# Patient Record
Sex: Female | Born: 1985 | Race: Black or African American | Hispanic: No | Marital: Married | Smoking: Never smoker
Health system: Southern US, Community
[De-identification: ages and names within clinical notes are randomized; demographics above are authoritative.]

## PROBLEM LIST (undated history)

## (undated) DIAGNOSIS — F53 Postpartum depression: Secondary | ICD-10-CM

## (undated) DIAGNOSIS — O99345 Other mental disorders complicating the puerperium: Secondary | ICD-10-CM

## (undated) DIAGNOSIS — F419 Anxiety disorder, unspecified: Secondary | ICD-10-CM

## (undated) HISTORY — DX: Postpartum depression: F53.0

## (undated) HISTORY — DX: Other mental disorders complicating the puerperium: O99.345

## (undated) HISTORY — DX: Anxiety disorder, unspecified: F41.9

---
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
## 2007-04-22 ENCOUNTER — Emergency Department: Payer: Self-pay | Admitting: Emergency Medicine

## 2007-05-13 ENCOUNTER — Emergency Department: Payer: Self-pay | Admitting: Internal Medicine

## 2008-02-03 ENCOUNTER — Emergency Department (HOSPITAL_COMMUNITY): Admission: EM | Admit: 2008-02-03 | Discharge: 2008-02-04 | Payer: Self-pay | Admitting: Emergency Medicine

## 2008-06-09 ENCOUNTER — Emergency Department: Payer: Self-pay

## 2008-11-13 ENCOUNTER — Emergency Department: Payer: Self-pay | Admitting: Emergency Medicine

## 2009-06-23 ENCOUNTER — Encounter: Payer: Self-pay | Admitting: Obstetrics & Gynecology

## 2009-06-23 ENCOUNTER — Ambulatory Visit: Payer: Self-pay | Admitting: Obstetrics and Gynecology

## 2009-07-20 ENCOUNTER — Ambulatory Visit: Payer: Self-pay | Admitting: Obstetrics & Gynecology

## 2009-07-20 LAB — CONVERTED CEMR LAB: GC Probe Amp, Urine: NEGATIVE

## 2009-08-16 ENCOUNTER — Ambulatory Visit: Payer: Self-pay | Admitting: Obstetrics and Gynecology

## 2009-08-17 ENCOUNTER — Encounter: Payer: Self-pay | Admitting: Obstetrics and Gynecology

## 2009-08-17 LAB — CONVERTED CEMR LAB
WBC, Wet Prep HPF POC: NONE SEEN
Yeast Wet Prep HPF POC: NONE SEEN

## 2009-09-20 ENCOUNTER — Ambulatory Visit: Payer: Self-pay | Admitting: Obstetrics & Gynecology

## 2009-09-23 ENCOUNTER — Ambulatory Visit: Payer: Self-pay | Admitting: Obstetrics & Gynecology

## 2009-11-17 ENCOUNTER — Ambulatory Visit: Payer: Self-pay | Admitting: Family Medicine

## 2009-11-17 LAB — CONVERTED CEMR LAB
Chlamydia, DNA Probe: NEGATIVE
GC Probe Amp, Genital: NEGATIVE

## 2009-11-18 ENCOUNTER — Encounter: Payer: Self-pay | Admitting: Family Medicine

## 2009-11-18 LAB — CONVERTED CEMR LAB
Clue Cells Wet Prep HPF POC: NONE SEEN
Yeast Wet Prep HPF POC: NONE SEEN

## 2009-12-30 ENCOUNTER — Ambulatory Visit: Payer: Self-pay | Admitting: Obstetrics and Gynecology

## 2009-12-30 LAB — CONVERTED CEMR LAB: Chlamydia, Swab/Urine, PCR: NEGATIVE

## 2010-02-10 ENCOUNTER — Encounter: Payer: Self-pay | Admitting: Obstetrics and Gynecology

## 2010-02-10 ENCOUNTER — Ambulatory Visit: Payer: Self-pay | Admitting: Obstetrics & Gynecology

## 2010-02-14 ENCOUNTER — Encounter: Payer: Self-pay | Admitting: Obstetrics and Gynecology

## 2010-02-14 LAB — CONVERTED CEMR LAB: LH: 3.5 milliintl units/mL

## 2010-03-15 ENCOUNTER — Ambulatory Visit: Payer: Self-pay | Admitting: Family Medicine

## 2010-04-06 ENCOUNTER — Encounter: Payer: Self-pay | Admitting: Obstetrics & Gynecology

## 2010-04-06 ENCOUNTER — Ambulatory Visit: Payer: Self-pay | Admitting: Obstetrics & Gynecology

## 2010-04-06 LAB — CONVERTED CEMR LAB
Antibody Screen: NEGATIVE
hCG, Beta Chain, Quant, S: 41384.4 milliintl units/mL

## 2010-04-07 ENCOUNTER — Ambulatory Visit (HOSPITAL_COMMUNITY)
Admission: RE | Admit: 2010-04-07 | Discharge: 2010-04-07 | Payer: Self-pay | Source: Home / Self Care | Admitting: Obstetrics & Gynecology

## 2010-04-13 ENCOUNTER — Encounter: Payer: Self-pay | Admitting: Obstetrics & Gynecology

## 2010-04-13 ENCOUNTER — Ambulatory Visit: Payer: Self-pay | Admitting: Obstetrics and Gynecology

## 2010-04-13 LAB — CONVERTED CEMR LAB
Antibody Screen: NEGATIVE
Eosinophils Absolute: 0.1 10*3/uL (ref 0.0–0.7)
HCT: 40.2 % (ref 36.0–46.0)
Hepatitis B Surface Ag: NEGATIVE
Hgb F Quant: 0 % (ref 0.0–2.0)
Hgb S Quant: 0 % (ref 0.0–0.0)
Lymphocytes Relative: 13 % (ref 12–46)
Neutro Abs: 6.3 10*3/uL (ref 1.7–7.7)
Platelets: 276 10*3/uL (ref 150–400)
RDW: 12.2 % (ref 11.5–15.5)
Rubella: 212.5 intl units/mL — ABNORMAL HIGH

## 2010-04-26 ENCOUNTER — Ambulatory Visit: Payer: Self-pay | Admitting: Family Medicine

## 2010-05-03 ENCOUNTER — Emergency Department: Payer: Self-pay | Admitting: Emergency Medicine

## 2010-05-10 ENCOUNTER — Ambulatory Visit: Payer: Self-pay | Admitting: Obstetrics & Gynecology

## 2010-05-17 ENCOUNTER — Ambulatory Visit: Payer: Self-pay | Admitting: Family Medicine

## 2010-06-07 ENCOUNTER — Ambulatory Visit: Payer: Self-pay | Admitting: Obstetrics & Gynecology

## 2010-06-28 ENCOUNTER — Ambulatory Visit (HOSPITAL_COMMUNITY)
Admission: RE | Admit: 2010-06-28 | Discharge: 2010-06-28 | Payer: Self-pay | Source: Home / Self Care | Attending: Obstetrics & Gynecology | Admitting: Obstetrics & Gynecology

## 2010-06-29 ENCOUNTER — Ambulatory Visit: Payer: Self-pay | Admitting: Family Medicine

## 2010-07-13 ENCOUNTER — Ambulatory Visit
Admission: RE | Admit: 2010-07-13 | Discharge: 2010-07-13 | Payer: Self-pay | Source: Home / Self Care | Attending: Obstetrics and Gynecology | Admitting: Obstetrics and Gynecology

## 2010-07-15 ENCOUNTER — Ambulatory Visit (HOSPITAL_COMMUNITY)
Admission: RE | Admit: 2010-07-15 | Discharge: 2010-07-15 | Payer: Self-pay | Source: Home / Self Care | Attending: Family Medicine | Admitting: Family Medicine

## 2010-08-09 ENCOUNTER — Encounter: Payer: Self-pay | Admitting: Obstetrics & Gynecology

## 2010-08-09 ENCOUNTER — Ambulatory Visit
Admission: RE | Admit: 2010-08-09 | Discharge: 2010-08-09 | Payer: Self-pay | Source: Home / Self Care | Attending: Obstetrics & Gynecology | Admitting: Obstetrics & Gynecology

## 2010-08-09 LAB — CONVERTED CEMR LAB: Chlamydia, Swab/Urine, PCR: NEGATIVE

## 2010-08-10 ENCOUNTER — Encounter: Payer: Self-pay | Admitting: Obstetrics & Gynecology

## 2010-08-30 ENCOUNTER — Encounter: Payer: Self-pay | Admitting: Obstetrics & Gynecology

## 2010-08-30 ENCOUNTER — Encounter: Payer: No Typology Code available for payment source | Admitting: Obstetrics and Gynecology

## 2010-08-30 DIAGNOSIS — Z3400 Encounter for supervision of normal first pregnancy, unspecified trimester: Secondary | ICD-10-CM

## 2010-08-30 LAB — CONVERTED CEMR LAB
HCT: 34.9 % — ABNORMAL LOW (ref 36.0–46.0)
HIV: NONREACTIVE
MCV: 97.5 fL (ref 78.0–100.0)
Platelets: 235 10*3/uL (ref 150–400)
RBC: 3.58 M/uL — ABNORMAL LOW (ref 3.87–5.11)
RDW: 12.5 % (ref 11.5–15.5)
WBC: 8.5 10*3/uL (ref 4.0–10.5)

## 2010-09-20 ENCOUNTER — Encounter: Payer: No Typology Code available for payment source | Admitting: Obstetrics and Gynecology

## 2010-09-20 DIAGNOSIS — Z3400 Encounter for supervision of normal first pregnancy, unspecified trimester: Secondary | ICD-10-CM

## 2010-09-26 ENCOUNTER — Other Ambulatory Visit: Payer: No Typology Code available for payment source

## 2010-09-26 DIAGNOSIS — Z111 Encounter for screening for respiratory tuberculosis: Secondary | ICD-10-CM

## 2010-10-04 ENCOUNTER — Encounter: Payer: No Typology Code available for payment source | Admitting: Obstetrics and Gynecology

## 2010-10-04 DIAGNOSIS — Z3400 Encounter for supervision of normal first pregnancy, unspecified trimester: Secondary | ICD-10-CM

## 2010-10-07 ENCOUNTER — Observation Stay: Payer: Self-pay | Admitting: Obstetrics & Gynecology

## 2010-10-10 ENCOUNTER — Encounter: Payer: PRIVATE HEALTH INSURANCE | Admitting: Obstetrics and Gynecology

## 2010-10-10 ENCOUNTER — Inpatient Hospital Stay (HOSPITAL_COMMUNITY)
Admission: AD | Admit: 2010-10-10 | Discharge: 2010-10-17 | DRG: 775 | Disposition: A | Discharge status: Home / Self Care | Payer: Medicaid Other | Source: Ambulatory Visit | Attending: Obstetrics and Gynecology | Admitting: Obstetrics and Gynecology

## 2010-10-10 DIAGNOSIS — Z22330 Carrier of Group B streptococcus: Secondary | ICD-10-CM

## 2010-10-10 DIAGNOSIS — O9989 Other specified diseases and conditions complicating pregnancy, childbirth and the puerperium: Secondary | ICD-10-CM

## 2010-10-10 DIAGNOSIS — O99892 Other specified diseases and conditions complicating childbirth: Secondary | ICD-10-CM | POA: Diagnosis present

## 2010-10-10 LAB — URINE MICROSCOPIC-ADD ON

## 2010-10-10 LAB — CBC
Hemoglobin: 12.6 g/dL (ref 12.0–15.0)
MCH: 33.3 pg (ref 26.0–34.0)
MCHC: 34.9 g/dL (ref 30.0–36.0)
MCV: 95.5 fL (ref 78.0–100.0)
Platelets: 256 10*3/uL (ref 150–400)
RDW: 12.3 % (ref 11.5–15.5)

## 2010-10-10 LAB — URINALYSIS, ROUTINE W REFLEX MICROSCOPIC
Ketones, ur: NEGATIVE mg/dL
Nitrite: NEGATIVE
Specific Gravity, Urine: 1.01 (ref 1.005–1.030)
Urobilinogen, UA: 1 mg/dL (ref 0.0–1.0)

## 2010-10-11 LAB — URINE CULTURE
Colony Count: NO GROWTH
Culture: NO GROWTH

## 2010-10-11 LAB — CBC
HCT: 32.6 % — ABNORMAL LOW (ref 36.0–46.0)
Hemoglobin: 10.8 g/dL — ABNORMAL LOW (ref 12.0–15.0)
MCHC: 33.1 g/dL (ref 30.0–36.0)
Platelets: 232 10*3/uL (ref 150–400)
RBC: 3.34 MIL/uL — ABNORMAL LOW (ref 3.87–5.11)
RDW: 12.5 % (ref 11.5–15.5)
WBC: 15.5 10*3/uL — ABNORMAL HIGH (ref 4.0–10.5)

## 2010-10-12 ENCOUNTER — Encounter: Payer: PRIVATE HEALTH INSURANCE | Admitting: Obstetrics & Gynecology

## 2010-10-12 LAB — STREP B DNA PROBE

## 2010-10-13 LAB — MAGNESIUM: Magnesium: 6.7 mg/dL (ref 1.5–2.5)

## 2010-10-15 DIAGNOSIS — O9989 Other specified diseases and conditions complicating pregnancy, childbirth and the puerperium: Secondary | ICD-10-CM

## 2010-10-15 LAB — CBC
MCH: 32.9 pg (ref 26.0–34.0)
Platelets: 284 10*3/uL (ref 150–400)
RBC: 3.83 MIL/uL — ABNORMAL LOW (ref 3.87–5.11)
WBC: 12 10*3/uL — ABNORMAL HIGH (ref 4.0–10.5)

## 2010-11-14 ENCOUNTER — Ambulatory Visit (INDEPENDENT_AMBULATORY_CARE_PROVIDER_SITE_OTHER): Payer: Medicaid Other | Admitting: Obstetrics & Gynecology

## 2010-11-22 NOTE — Assessment & Plan Note (Signed)
[name], [name]              ACCOUNT NO.:  [id number]   MEDICAL RECORD NO.:  [id number]          PATIENT TYPE:  POB   LOCATION:  [facility] at [facility]         FACILITY:  [facility]   PHYSICIAN:  Tinnie Gens, MD        DATE OF BIRTH:  03-26-1986   DATE OF SERVICE:  11/17/2009                                  CLINIC NOTE   CHIEF COMPLAINT:  Vaginal discharge.   HISTORY OF PRESENT ILLNESS:  The patient is a 25 year old, nullipara,  who has a history of Chlamydia in the past.  She comes in today with  white-creamy vaginal discharge, some minor irritation related to that,  no significant odor, no burning with urination, no semi-itching.  She  denies fevers, chills, nausea, vomiting, or abdominal pain.  The patient  does bath 3-4 times weekly, but she does not douche.  The patient has  previously been seen and had BV in the past and she has also had history  of Chlamydia which was treated when she got from an ex-boyfriend.  She  has a negative test of cure since then but she is still worried that  that may be a problem again.  Additionally, the patient was having  difficulty to remember to take her pill.  She has not taken anything  birth control since December.  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  She is currently using condoms for birth control,  wants to know if there is something better than this.   PHYSICAL EXAMINATION:  VITAL SIGNS:  As noted in the chart.  GENERAL:  She is a well-nourished, well-nourished female in no acute  distress.  GU:  Normal external female genitalia.  BUS is normal.  Vagina is pink  and rugated.  There is no significant vaginal discharge noted. The  cervix is nulliparous without lesion.  There is no cervical motion  tenderness.  Adnexa, without mass or tenderness.   IMPRESSION:  1. Questionable vaginal discharge none really seen on today's exam.  2. Inability to remember to take the pill and need for contraception.   PLAN:  We  gave her a sample of the ring.  If this works out well for  her, we will call in another prescription.  Check GC, Chlamydia, and wet  prep today.  We will call treatment in based on results.           ______________________________  Tinnie Gens, MD     TP/MEDQ  D:  11/17/2009  T:  11/18/2009  Job:  [id number]

## 2010-11-22 NOTE — Assessment & Plan Note (Signed)
NAMESTEPANIE, [name]              ACCOUNT NO.:  [id number]   MEDICAL RECORD NO.:  [id number]          PATIENT TYPE:  POB   LOCATION:  [facility]         FACILITY:  [facility]   PHYSICIAN:  Argentina Donovan, MD        DATE OF BIRTH:  08-03-1985   DATE OF SERVICE:  12/30/2009                                  CLINIC NOTE   The patient is a 25 year old African American female who has been in  several times with urinary pressure and frequency, always having had a  normal urinalysis and culture.  Today with similar thing occurred, she  is having pressure and dysuria.  She has discomfort after she avoids,  wet prep was taken, although the vaginal discharge looked normal.  She  also complains of some itching both the mons and the pubic area with a  magnifying instrument and a bright light.  I examined that I saw no sign  of any rash or lice or any other reason for her itching.  She did say  she just changed soaps recently, I have told her to go back to the old  one, and I think that we will probably going to need to have her see a  urologist.  If the wet prep comes back negative which I am pretty sure,  then team is going to refer the patient to a urologist.   IMPRESSION:  Urinary symptoms of unknown etiology.           ______________________________  Argentina Donovan, MD     PR/MEDQ  D:  12/30/2009  T:  12/30/2009  Job:  [id number]

## 2010-11-22 NOTE — Assessment & Plan Note (Signed)
Kerry [name], [name]              ACCOUNT NO.:  [id number]   MEDICAL RECORD NO.:  [id number]          PATIENT TYPE:  POB   LOCATION:  [facility]         FACILITY:  [facility]   PHYSICIAN:  Tinnie Gens, MD        DATE OF BIRTH:  Mar 11, 1986   DATE OF SERVICE:  03/15/2010                                  CLINIC NOTE   CHIEF COMPLAINT:  Follow up amenorrhea.   HISTORY OF PRESENT ILLNESS:  The patient is a 25 year old gravida 0 who  was seen in early August for amenorrhea.  At that time, she had a workup  that included TSH, FSH, and LH, those were normal.  She also had a  Provera challenge which resulted in a bleed.  She gained 13 pounds over  the last year.  She is unclear as to why this has happened.  She is  interested in getting a NuvaRing.  She has many questions regarding  weight, diet and exercise as well as different methods of contraception.   PHYSICAL EXAMINATION:  VITAL SIGNS:  As noted in the chart.  GENERAL:  She is a well-developed, well-nourished female in no acute  distress.  ABDOMEN:  Soft, nontender, nondistended.   IMPRESSION:  1. Amenorrhea.  2. Undesired fertility.  3. Obesity.   PLAN:  A lengthy discussion was had with the patient regarding weight,  exercise, risks of calories, cutting down of fruit juices, alcoholic  beverages.  Additionally, time was spent discussing methods of  contraception, at least 15 minutes were spent on discussion of birth  control pills, the NuvaRing, Depo-Provera, Implanon, IUD insertion.  After discussion of all of these different things, the patient decided  to continue with NuvaRing.  She will go until approximately 1 week after  her last month and 1 week after her last normal cycle.  If she fails to  get a period, she will do Provera challenge of 5 mg p.o. daily x5 days.  If this result in a cycle, she will start NuvaRing on the next Sunday.  One sample was given as a prescription.  If this does not result in  period, she  was instructed to take a pregnancy test.  The patient will  return in 2-3 months for followup with this and to make sure the birth  control is working for her.           ______________________________  Tinnie Gens, MD     TP/MEDQ  D:  03/15/2010  T:  03/16/2010  Job:  [id number]

## 2010-11-22 NOTE — Discharge Summary (Signed)
  NAME:  Kerry [name], Kerry [name]            ACCOUNT NO.:  [id number]  MEDICAL RECORD NO.:  [id number]           PATIENT TYPE:  I  LOCATION:  9306                          FACILITY:  [facility]  PHYSICIAN:  Catalina Antigua, MD     DATE OF BIRTH:  [birth year]  DATE OF ADMISSION:  10/10/2010 DATE OF DISCHARGE:  10/17/2010                              DISCHARGE SUMMARY   SURGERIES:  Epidural placement.  HOSPITAL COURSE:  The patient was admitted from MAU with preterm labor, was having contractions at 91 and 2 weeks' G1, P0.  On admission, had reassuring fetal status. The patient was admitted to the antenatal unit and the NICU was contacted because of preterm labor and impending delivery.  Tocolysis was initiated with magnesium sulfate. The magnesium  was stopped on hospital day #2 because of an elevated magnesium level and  tocolysis was continued with procardia.  The patient was started on ampicillin for unknown GBS status.  Her group B strep grew back on October 10, 2010 with a final on October 12, 2010, which was positive and ampicillin was continueded for that reason.  Despite tocolysis with magnesium sulfate  and procardia, patient conitued to make cervical change. On October 15, 2010, a viable female was delivered at 12 and 6 weeks with Apgar of 7 and 9.  The NICU team was present at delivery.  There was a cord around the foot.  The infant was handed to the NICU team.  Core pH was acquired, which was normal.  A laceration first degree was repaired. The patient had postoperative fever of 100.6, was started on Zosyn 3.375 mg q.8 h.  On postpartum day #1, October 16, 2010, the patient was improved, was afebrile with stable vitals.  The baby was in the NICU and on October 17, 2010, mother was doing well and was at home level for discharge with stable vitals.  Hemoglobin 12.6 over hematocrit 37.1, 98.7 T-max.  The patient was discharged home with follow up in 6 weeks at the [facility].    ______________________________ Edd Arbour, MD   ______________________________ Catalina Antigua, MD    JO/MEDQ  D:  11/07/2010  T:  11/08/2010  Job:  [id number]  Electronically Signed by Edd Arbour MD on 11/16/2010 12:38:37 PM Electronically Signed by Catalina Antigua  on 11/22/2010 06:47:46 PM

## 2010-11-22 NOTE — Assessment & Plan Note (Signed)
NAMECAREY, LAFON              ACCOUNT NO.:  [id number]   MEDICAL RECORD NO.:  [id number]          PATIENT TYPE:  POB   LOCATION:  [facility]         FACILITY:  [facility]   PHYSICIAN:  Catalina Antigua, MD     DATE OF BIRTH:  1986/01/13   DATE OF SERVICE:  06/23/2009                                  CLINIC NOTE   This is a 25 year old, para 0 with LMP of November 26, who presents for  annual exam.  The patient was previously using Nortrel for birth  control, but is wishing to discontinue the use of her birth control and  exclusively used condoms.  The patient desires to discontinue her birth  control for no other reason and she does not want to have to take the  pills every day.  The patient is not interested in other forms of birth  control methods at this time other than the use of condoms.  The patient  is also complaining of pain with intercourse that time that she may  finally be treated to poor lubrication.  The patient is otherwise  without complaints.  She denies any past medical or past surgical  history.  She does not have a OB history, GYN history.  She reports no  history of abnormal Pap smear, cyst, or fibroids.  She does have a  history of Chlamydia in the past that was treated.   FAMILY HISTORY:  Significant for high blood pressure in her mother and  her father.   She denies any medication allergies and she is not taking any  medication.   REVIEW OF SYSTEMS:  Otherwise within normal limits.   PHYSICAL EXAMINATION:  VITAL SIGNS:  Blood pressure 110/73, pulse of  105, weight of 155 pounds, height of 5 feet 3 inches.  LUNGS:  Were clear to auscultation bilaterally.  HEART:  Regular rate and rhythm.  BREASTS:  Symmetric in size.  No palpable masses or adenopathy.  No  expressible nipple discharge.  No skin dimpling.  ABDOMEN:  Soft, nontender, nondistended.  PELVIC:  Normal vaginal mucosa.  No abnormal discharge or bleeding.  She  has small anteverted uterus.   No palpable adnexal masses or tenderness.   ASSESSMENT AND PLAN:  This is a 25 year old, para 0 with LMP November  26, who is here for annual exam.  Pap and cultures were performed.  The  patient was advised to use a water based lubrication during intercourse  to ease in her discomfort.  The patient was advised to continue to  religious use of condoms as well as plan be as needed.  The patient will  be contacted with any abnormal results.           ______________________________  Catalina Antigua, MD     PC/MEDQ  D:  06/23/2009  T:  06/24/2009  Job:  [id number]

## 2010-11-22 NOTE — Assessment & Plan Note (Signed)
Kerry [name], Kerry [name]              ACCOUNT NO.:  [id number]   MEDICAL RECORD NO.:  [id number]          PATIENT TYPE:  POB   LOCATION:  [facility]         FACILITY:  [facility]   PHYSICIAN:  Allie Bossier, MD        DATE OF BIRTH:  Nov 21, 1985   DATE OF SERVICE:  02/10/2010                                  CLINIC NOTE   HISTORY:  Kerry [name] is a 25 year old single black, gravida 0, who is here  because her last period was on May 26.  She has taken 2 pregnancy tests  and they are both negative and a pregnancy test today here is negative.  She is using condoms for birth control.  She also reports weight gain  which is confirmed by her weight here, please note that she has gained  13 pounds in less than a year.   On exam, her thyroid has some fullness but no discrete nodules.   ASSESSMENT AND PLAN:  Amenorrhea for 2 months with a negative pregnancy  test, I am checking TSH, FSH, and LH, and we will see her back when  these results are available.  I am also giving her Provera challenge 10  mg a day for 10 days.      Allie Bossier, MD     MCD/MEDQ  D:  02/10/2010  T:  02/11/2010  Job:  [id number]

## 2010-12-20 ENCOUNTER — Ambulatory Visit (INDEPENDENT_AMBULATORY_CARE_PROVIDER_SITE_OTHER): Payer: Medicaid Other | Admitting: Family Medicine

## 2010-12-20 DIAGNOSIS — F329 Major depressive disorder, single episode, unspecified: Secondary | ICD-10-CM

## 2010-12-21 NOTE — Assessment & Plan Note (Signed)
NAME:  Kerry [name], Kerry [name]            ACCOUNT NO.:  [id number]  MEDICAL RECORD NO.:  [id number]           PATIENT TYPE:  LOCATION:  [facility]           FACILITY:  PHYSICIAN:  Tinnie Gens, MD        DATE OF BIRTH:  1986-02-12  DATE OF SERVICE:  12/20/2010                                 CLINIC NOTE  CHIEF COMPLAINT:  Postpartum depression and needs a DTaP.  HISTORY OF PRESENT ILLNESS:  The patient is a 25 year old gravida 1, para 1 who comes in today a month after postpartum check reporting that she feels very emotional.  She is highly upset.  She feels like she might hurt her husband.  She does not really to understand her.  She is having an emotional breakdown all the time.  She is not suicidal.  She is not really homicidal.  She reports headache and her blood pressure is up.  She is so emotionally distraught.  She has had difficulty sleeping. She is pumping exclusively for her baby, but she is continuously pumping with a hand held pump, when she is awake, she has not really been using that.  She has been up for 2 hours at a stretch in the night and is not getting nearly enough sleep.  She still works full time.  She takes her baby with her to work.  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  On exam, she has a flat affect very tearful during our conversation.  IMPRESSION:  Postpartum depression.  PLAN:  Start Celexa, start every other day for the first 3-5 days and then start medicine daily.  Despite this, she should be safe for the breast-feeding.  She can see how her baby responds and it may make her somewhat sleepier.  If she decides to come off, she should cover slowly. She should follow up with [place] in 4 weeks to see how this medicine and her depression is doing.          ______________________________ Tinnie Gens, MD    TP/MEDQ  D:  12/20/2010  T:  12/21/2010  Job:  [id number]

## 2011-01-17 ENCOUNTER — Ambulatory Visit: Payer: Medicaid Other | Admitting: Nurse Practitioner

## 2011-01-17 ENCOUNTER — Ambulatory Visit: Payer: Medicaid Other | Admitting: Obstetrics & Gynecology

## 2011-01-20 NOTE — Assessment & Plan Note (Signed)
NAME:  Kerry [name], Kerry [name]            ACCOUNT NO.:  [id number]  MEDICAL RECORD NO.:  [id number]           PATIENT TYPE:  I  LOCATION:  [facility]          FACILITY:  [facility]  PHYSICIAN:  Scheryl Darter, MD       DATE OF BIRTH:  16-Jan-1986  DATE OF SERVICE:                                 CLINIC NOTE  HISTORY OF PRESENT ILLNESS:  The patient comes today as postpartum visit after a vaginal delivery on October 15, 2010.  She delivered prematurely at 33 weeks 6 days.  She had onset of low-grade fever during labor.  Baby spent almost 2 weeks in the hospital and is now doing well.  The patient is pumping breast milk and feeding breast milk exclusively.  She started taking Micronor recently.  She still had bleeding after delivery.  No complaints of pain or abnormal discharge or fever.  No complaints of urinary symptoms.  PHYSICAL EXAMINATION:  GENERAL:  The patient has normal affect. VITAL SIGNS:  Weight is 178 pounds, height 5 feet 3 inches, blood pressure 105/66, pulse 97. ABDOMEN:  Soft, nontender.  No mass. EXTERNAL GENITALIA:  Vagina and cervix showed moderate dark blood. Cervix appeared normal.  A small suture of left labia minor was trimmed. Uterus is normal size, nontender.  No mass.  IMPRESSION:  The patient is doing well.  She can continue with her Micronor.  She can return for yearly exam in about 6 months.     Scheryl Darter, MD    JA/MEDQ  D:  11/14/2010  T:  11/15/2010  Job:  [id number]

## 2011-02-24 ENCOUNTER — Other Ambulatory Visit: Payer: Self-pay | Admitting: Obstetrics and Gynecology

## 2011-02-26 ENCOUNTER — Other Ambulatory Visit: Payer: Self-pay | Admitting: Obstetrics and Gynecology

## 2011-03-14 ENCOUNTER — Encounter: Payer: Self-pay | Admitting: Family Medicine

## 2011-03-14 ENCOUNTER — Ambulatory Visit (INDEPENDENT_AMBULATORY_CARE_PROVIDER_SITE_OTHER): Payer: Medicaid Other | Admitting: Family Medicine

## 2011-03-14 DIAGNOSIS — Z309 Encounter for contraceptive management, unspecified: Secondary | ICD-10-CM

## 2011-03-14 DIAGNOSIS — IMO0001 Reserved for inherently not codable concepts without codable children: Secondary | ICD-10-CM

## 2011-03-14 DIAGNOSIS — Z789 Other specified health status: Secondary | ICD-10-CM

## 2011-03-14 MED ORDER — PRENATAL RX 60-1 MG PO TABS: 1.0000 | ORAL_TABLET | Freq: Every day | ORAL | Status: AC

## 2011-03-14 MED ORDER — NORETHINDRONE 0.35 MG PO TABS: 1.0000 | ORAL_TABLET | Freq: Every day | ORAL | Status: DC

## 2011-03-14 NOTE — Progress Notes (Signed)
  Subjective:    Patient ID: Kerry [name], female    DOB: 06/21/1986, 25 y.o.   MRN: [id number]  HPI Here today for decreased milk supply.  She tells [place] about DSS involvement with her child who has been removed from her care.   She has kept all prenatal appointments and postpartum follow-up.  She is most concerned for her decreasing milk supply.  She has been exclusively pumping and supplementing as needed.  She is interested in what will help with this. She was last seen in June with  Some pp depression, that she reports resolved without needing medication.   Review of Systems     Objective:   Physical Exam Somewhat tearful, but engaging and forthcoming about all issues. Makes excellent eye contact.      Assessment & Plan:  Diminished milk supply Trial of [place].  See AVS. May benefit from spending more time, supervised with her daughter and having her latch to improve milk supply.

## 2011-03-14 NOTE — Patient Instructions (Signed)
Try Fenugreek capsules.  Take 2 capsules three times daily to improve milk supply.  Do not buy at [facility].  It will make your sweat and urine smell like maple syrup.  Breastfeeding Challenges Breastfeeding is often the best way to feed your baby. Challenges may discourage you from breastfeeding. But solutions can usually be found to help you. Some babies have conditions that may interfere with or make breastfeeding more difficult. But, in all of the following cases, breastfeeding is still best for a baby's health.  ADVANTAGES OF BREASTFEEDING  Breast-fed babies tend to be healthier and less affected by disease.   Breast-fed babies may have better brain development and be less likely to be overweight than formula-fed babies.   Breastfeeding also benefits the mother. It will give you time to be close to your baby and helps create a strong bond. It also:   Delays the return of your periods.   Stimulates your uterus to contract back to normal.   Helps you lose some of the weight you gained during pregnancy.   Breastfeeding is also cheaper than formula feeding. It also does not require mixing formula, heating bottles, or washing extra dishes.   Breastfeeding mothers have a lower risk of developing breast cancer.   Breastfeeding should be encouraged in women with gestational diabetes and diabetes types I & II.  BREASTFEEDING CHALLENGES  Breastfeeding involves taking the time to get to know your baby's patterns and responding to his cues. Once breastfeeding is well established, feedings usually become more regular and more widely spaced. Some mothers do not nurse their babies because they come across problems early on. If at all possible, begin breastfeeding your baby within an hour after delivery. The first milk you produce is called colostrum. It is packed with nutrients and disease-fighting substances. These will help nourish and protect your baby against infections as he or she grows up.   Some  babies are unable to breastfeed because of premature birth and small size along with weakness and difficulty sucking. Sometimes with birth defects of the mouth (cleft lip or cleft palate) the mother may be able to pump breastmilk to give to her baby. Some digestive problems (breast milk jaundice, galactosemia) may be reasons not to nurse. See a lactation consultant if you have a breast infection or breast abscess, breast cancer or other cancer, previous surgery or radiation treatment, or inadequate milk supply (uncommon).  SOME MOTHERS ARE ADVISED NOT TO BREASTFEED DUE TO HEALTH PROBLEMS SUCH AS:  Serious illnesses.  Severe malnutrition.   Undergoing Radiation therapy.   Taking psychiatric medication.   Active herpes lesions on the breast.  Chicken pox or Shingles.   Active, untreated tuberculosis.   HIV (human immunodeficiency virus) infection.   Drug/alcohol addiction.   Undergoing radioactive iodine therapy.   Leukemia human cell Type I/II.   BREASTFEEDING SHOULD NOT BE PAINFUL  It is natural for minor problems to arise in first time breastfeeding. Problems you may have and some solutions are as follows:   Nipple soreness may be caused by:   Improper position of baby.   Improper feeding techniques.   Improper nipple care.   For many women, there is no identified cause. A simple change in your baby's position while feeding may relieve nipple soreness. Some breastfeeding mothers report nipple soreness only during the initial adjustment period.   If there is tenderness at first, it should gradually go away as the days go by. Poor latch-on and positioning are common causes of  sore nipples. This is usually because the baby is not getting enough of the areola into his or her mouth, and is sucking mostly on the nipple. The areola is the colored portion around the nipple. In general, nurse early and often. Nurse with the nipple and areola in the baby's mouth, not just the nipple. And  feed your baby on demand.   If you have sore nipples and put off feedings because of the pain, this can lead to your breasts becoming overly full. This may lead to plugged milk ducts in the breast followed by engorgement or even infection of the breasts. If your baby is latched on correctly, he/she should be able to nurse as long as needed without causing any pain. If it hurts, take the baby off of your breast and try again. Ask for help if it is still painful for you.   Check the positioning of your baby's body and the way she latches on and sucks. To minimize soreness, your baby's mouth should be open wide with as much of the areola in his or her mouth as possible. You should find that it feels better right away once the baby is positioned correctly.   Do not delay feedings. Try to relax so your let-down reflex comes easily. You also can hand-express a little milk before beginning the feeding so your baby does not clamp down harder, waiting for the milk to come.   If your nipples are very sore, it may be helpful to change positions each time you nurse. This puts the pressure on a different part of the nipple.   After nursing, you can also express a few drops of milk and gently rub it on your nipples. Human milk has natural healing properties. Let your nipples air-dry after feeding, or wear a soft-cotton shirt.   Wearing a nipple shield during nursing will not relieve sore nipples. They actually can prolong soreness by making it hard for the baby to learn to nurse without the shield.   Avoid wearing bras or clothes that are too tight and put pressure on your nipples. If you wear a bra, get one that offers good support to your breasts.   Change nursing pads often to avoid trapping in moisture. Only use cotton pads.   Avoid using soap, ointments or other chemicals on your nipples. Make sure to avoid products that must be removed before nursing. Washing with clean water is all that is necessary to  keep your nipples and breasts clean.   Try rubbing pure lanolin on your nipples after breastfeeding to soothe the pain.   Making sure you get enough rest, eating healthy foods, and getting enough fluids also can help the healing process. If you have very sore nipples, you can ask your doctor about using non-aspirin pain relievers.   Another cause of sore nursing is a condition called thrush. This is a fungal infection that can form on your nipples from the milk. Other signs of thrush include itching, flaking and drying skin, tender or pink skin. The infection also can form in the baby's mouth from having contact with your nipples. There it appears as little white spots on the inside of the cheeks, gums, or tongue. It also can appear as a diaper rash on your baby that will not go away by using regular diaper rash ointments. If you have any of these symptoms or think you have thrush, contact your doctor and your baby's doctor, or a Advertising copywriter. A lactation  consultant is a breastfeeding Risk analyst. You can get medication for your nipples and for your baby.   If you still have sore nipples after following the above tips, you may need to see someone who is trained in breastfeeding, like a Advertising copywriter.  ENGORGEMENT Engorgement is a condition after pregnancy, when your breasts feel very hard and painful. You also may have breast swelling, tenderness, warmth, redness, throbbing and flattening of the nipple. Engorgement may cause a low-grade fever. This can be confused with a breast infection. Engorgement is the result of the milk building up. It usually happens during the third to fifth day after birth. This slows circulation. When blood and lymph move through the breasts, fluid from the blood vessels can seep into the breast tissues. All of the following can cause engorgement.  Poor positioning.   Infrequent feedings.   Giving supplementary bottles of water, juice, formula, or  breast milk or using a pacifier. All of these cut down on your feeding and may lead to engorgement.   Changing the breastfeeding schedule with decreasing in feeding.   The baby changes the nursing pattern.  Having a baby with a weak suck who is not able to nurse effectively.   Fatigue, stress, or anemia in the mother.   An overabundant milk supply.   Nipple damage.   Breast abnormalities.   Engorgement can lead to plugged ducts or a breast infection. So it is important to try to prevent it before this happens. If treated properly, engorgement should only last for one to two days.  Minimize engorgement by making sure the baby is latched on and positioned correctly at your breast.   Nurse frequently after birth. Allow the baby to nurse as long as he/she likes, as long as he/she is latched on well and sucking effectively.   In the early days when your milk is coming in, you should awaken a sleepy baby every 2 to 3 hours to breastfeed. Breastfeeding often on the affected side helps to remove the milk and keeps milk moving freely. This prevents overfilling of the breast.   Avoid additional bottles and pacifiers.   Try hand expressing or pumping a little milk to first soften the breast, areola, and nipple before breastfeeding. Or massage the breast before feeding.   Cold compresses in between feedings can help ease pain and swelling.   If you are returning to work, try to pump your milk on the same schedule your baby was fed.   Eat a well balanced diet and drink plenty of fluids.   Use a well-fitting, supportive bra that is not too tight.   If your engorgement lasts for more than two days even after treating it, contact a Advertising copywriter.   Use a breast pump to keep up with your nursing schedule.   Use a breast pump if your baby is not taking enough milk or you feel you may be getting engorged.  PLUGGED DUCTS AND INFECTION Plugged ducts and breast infection (mastitis) are  common sources of sore breasts postpartum. It is common for many women to have a plugged duct in the breast at some point if breastfeeding.   A plugged milk duct feels like a tender, sore, lump in the breast. It is not accompanied by a fever or other symptoms. It happens when a milk duct does not properly drain. Then, pressure builds up behind the plug, and surrounding tissue becomes inflamed. A plugged duct usually only occurs in one breast at  a time.   A breast infection (mastitis), on the other hand, is soreness or a lump in the breast that can be accompanied by a fever and/or flu-like symptoms. You may feel run down or very achy. Some women with a breast infection also have nausea and vomiting. You also may have yellowish discharge from the nipple that looks like colostrum. Or the breasts feel warm or hot to the touch and appear pink or red. A breast infection can occur when other family members have a cold or the flu. Like a plugged duct, it usually only occurs in one breast. It is not always easy to tell the difference between a breast infection and a plugged duct. Both have similar symptoms and can improve within 24 to 48 hours.   Treatment for plugged ducts and breast infections is similar. But some breast infections need to also be treated with an antibiotic.   If mastitis is not treated quickly, it may lead to a breast abscess.   It may help to massage the area, starting behind the sore spot. Use your fingers in a circular motion and massage toward the nipple.   Breastfeed more often on the affected side. This helps loosen the plug, keeps the milk moving freely, and the breast from becoming overly full. Nursing every two hours, both day and night on the affected side first can be helpful.   Getting extra sleep or relaxing with your feet up can help speed healing. Often a plugged duct or breast infection is the first sign that a mother is doing too much and becoming overly tired.   Wear a  well-fitting supportive bra that is not too tight, since this can constrict milk ducts.   If you do not feel better within 24 hours of trying these steps, and you have a fever or your symptoms worsen, call your doctor. You may need an antibiotic. Also, if you have a breast infection in which both breasts look affected, or there is pus or blood in the milk, red streaks near the area, or your symptoms came on severely and suddenly, see your doctor right away.   Even if you need an antibiotic, continuing to breastfeed during treatment is best for both you and your baby. Most antibiotics will not affect your baby through your breast milk.  THRUSH Thrush (yeast) is a fungal infection that can form on your nipples or in your breast because it thrives on milk. The infection forms from an overgrowth of the candida organism. Candida usually exists in our bodies and is kept at healthy levels by the natural bacteria in our bodies. But, when the natural balance of bacteria is upset, candida can overgrow, causing an infection. Some of the things that can cause thrush include:   Having an overly moist environment on your skin or nipples that are sore or cracked.   Taking antibiotics, birth control pills or steroids.   Having a diet that contains large amounts of sugar or foods with yeast.   Having a chronic illness like HIV infection, diabetes, or anemia.  If you have sore nipples that last more than a few days even after you make sure your baby's latch and positioning is correct, or you suddenly get sore nipples after several weeks of unpainful nursing, you could have thrush. Some other signs of thrush include:   Pink, flaky, shiny, itchy or cracked nipples.   Deep pink and blistered nipples.   You also could have shooting pains deep in  the breast during or after feedings, or achy breasts.  The infection also can form in your baby's mouth from having contact with your nipples, and appear as little white  spots on the inside of the cheeks, gums, or tongue. It also can appear as a diaper rash (small red dots around a rash) on your baby that will not go away by using regular diaper rash ointments. Many babies with thrush refuse to nurse, or are gassy or cranky.  Solution:   If you or your baby have any of these symptoms, contact your doctor and your baby's doctor so you both can be correctly diagnosed.   You can get medication for your nipples and for your baby. Medication for a mother is usually an ointment for the nipples. Your baby can be given a liquid medication for his/her mouth, and/or an ointment for any diaper rash.   Change disposable nursing pads often. Wash any towels or clothing that come in contact with the yeast in very hot water (above 122 F or 50 C).   Wear a clean bra every day. Wash your hands often, and wash your baby's hands often, especially if he or she sucks on his/her fingers.   Only use cotton pads.   Boil any pacifiers, bottle nipples, or toys your baby puts in his or her mouth once a day for 20 minutes to kill the thrush. After one week of treatment, discard pacifiers and nipples and buy new ones.   Boil daily for 20 minutes all breast pump parts that touch the milk.   Make sure other family members are free of thrush or other fungal infections. If they have symptoms, get them treatment.  NURSING STRIKE A nursing strike is when your baby has been nursing well for months, then suddenly loses interest in breastfeeding and begins to refuse the breast. A nursing strike can mean several things are happening with your baby and that she or he is trying to communicate with you to let you know that something is wrong. Not all babies will react the same to different situations that can cause a nursing strike. Some will continue to breastfeed without a problem, others may just become fussy at the breast, and others will refuse the breast entirely. Some of the major causes of a  nursing strike include:  Mouth pain from teething, a fungal infection, or a cold sore.   An ear infection.   Pain from a certain nursing position.   Being upset about a long separation from the mother or a major change in routine.   Being interested in other things around him or her.   A cold or stuffy nose that makes breathing difficult.   Reduced milk supply from supplementing with bottles or overuse of a pacifier.   Responding to the mother's strong reaction if the baby has bitten her.   Being upset about hearing arguing or people talking in a harsh voice with other family members while nursing.   Reacting to stress, over-stimulation, or having been repeatedly put off when wanting to nurse.   If your baby is on a nursing strike, it is normal to feel frustrated and upset, especially if your baby is unhappy. It is important not to feel guilty or that you have done something wrong. Your breasts also may become uncomfortable as the milk builds up.  Treatment  Try to express your milk manually or with a breast pump on the same schedule as the baby used to breastfeed  to avoid engorgement and plugged ducts.   Try another feeding method temporarily to give your baby your milk, such as a cup, dropper, or spoon. Keep track of your baby's wet diapers to make sure he/she is getting enough milk (5 to 6 per day).   Keep offering your breast to the baby. If the baby is frustrated, stop and try again later. Try when the baby is sleeping or very sleepy.   Try various breastfeeding positions.   Focus on the baby with all of your attention and comfort him or her with extra touching and cuddling.   Try nursing while rocking and in a quiet room free of distractions.  INVERTED OR LARGE NIPPLES Some women have nipples that naturally are inverted, or that turn inward instead of protruding, or that are flat and do not protrude. Inverted or flat nipples can sometimes make it harder to breastfeed  because your baby can have a harder time latching on. But remember that for breastfeeding to work, your baby has to latch on to both the nipple and the breast, so even inverted nipples can work just fine. Very large nipples can make it hard for the baby to get enough of the areola into his or her mouth to compress the milk ducts and get enough milk.  Know what type of nipples you have before you have your baby, so you can be prepared in case you have a problem getting your baby to latch on correctly.   Talk with a lactation consultant at the hospital or at a breastfeeding clinic for extra help if you have flat, inverted, or very large nipples.   Sometimes a Advertising copywriter can help inverted nipples to be pulled out with a small device before your baby is brought to your breast.   In many cases, inverted nipples will protrude more as the baby starts to latch on and as time passes. The baby's sucking will help.   Flat nipples cause fewer problems than inverted nipples. Good latch-on and positioning are usually enough to ensure that a baby latched to a flat nipple breastfeeds well.   The latch for babies of mothers with very large nipples will improve with time as the baby grows. In some cases, it might take several weeks to get the baby to latch well. A good milk supply helps insure that her baby will get enough milk.  RETURNING TO WORK More and more women are breastfeeding when they return to work because they believe in the benefits of breastfeeding. You can purchase or rent effective breast pumps and storage containers for your milk. Many employers are willing to set up special rooms for mothers who pump. But others are not as educated about the benefits of breastfeeding. Also, many women are not able to take off as much time as they'd like after having their babies and might have to return to work before breastfeeding is well established. After you have your baby, take as much time off as  possible. This will help you get your breastfeeding established well and may reduce the number of months you may need to pump your milk while you are at work.   If you plan to have your baby take a bottle of expressed breast milk while you are at work, you can introduce your baby to a bottle when he or she is around four weeks old. Otherwise, the baby might not accept the bottle later on. Once your baby is comfortable taking a bottle, it  is a good idea to have Dad or another family member offer a bottle of pumped breast milk on a regular basis so the baby stays in practice.   Let your employer and/or Geophysical data processor know that you plan to continue breastfeeding once you return to work. Before you return to work, or even before you have your baby, start talking with your employer about breastfeeding. Do not be afraid to request a clean and private area where you can pump your milk. If you do not have your own office space, you can ask to use a supervisor's office during certain times. Or you can ask to have a clean, clutter free corner of a storage room. All you need is a chair, a small table, and an outlet if you are using an electric pump. Many electric pumps also can run on batteries and do not require an outlet. You can lock the door and place a small sign on it that asks for some privacy. You can pump your breast milk during lunch or other breaks. You could suggest to your employer that you are willing to make up work time for time spent pumping milk.   After pumping, you can refrigerate your milk, place it in a cooler, or freeze it for the baby to be fed later. Many breast pumps come with carrying cases that have a section to store your milk with ice packs. If you do not have access to a refrigerator, you can leave it at room temperatures for up to 6 hours.   Many employers are NOT aware of state laws that state they have to allow you to breastfeed at your job. Under these laws, your employer is  required to set up a space for you to breastfeed and/or allow paid/unpaid time for breastfeeding employees. To see if your state has a breastfeeding law for employers, go to https://www.warren.info/ or call us at 1-800-LALECHE (in [place]).  JAUNDICE  Jaundice is a condition that is common in many newborns. It appears as a yellowing of the skin and eyes. It is caused by an excess of bilirubin, a yellow pigment that is a product in the blood. All babies are born with extra red blood cells that undergo a process of being broken down and eliminated from the body. Bilirubin levels in the blood can be high because of:  Higher production of it in a newborn.   Increased ability of the newborn intestine to absorb it.   Limited ability of the newborn liver to handle large amounts of it.  Many cases of jaundice do not need to be treated. Your baby's doctor will carefully monitor your baby's bilirubin levels. Sometimes infants have to be temporarily separated from their mothers to receive special treatment with phototherapy (aiming lights on the baby). In these cases, breastfeeding is encouraged but supplements may also be given to the baby. American Academy of Pediatrics advises against stopping breastfeeding in jaundiced babies and suggests continuing frequent breastfeeding, even during treatment. If your baby is jaundiced or develops jaundice, it is important to discuss with your baby's caregiver all possible treatment options. Share that you do not want to interrupt nursing if this is at all possible.  REFLUX  It is not unusual for babies to spit up after nursing. Usually, babies can spit up and show no other signs of illness. The spitting up disappears as the baby's digestive system matures. As long as the baby has 6 to 8 wet diapers and at least 2  bowel movements in a 24 hour period (under 85 weeks of age), and your baby is gaining weight (at least 4 ounces a week) you can be assured your baby is  getting enough milk.  However, some babies have a condition called gastroesophageal reflux (GER). This happens when the muscle at the opening of the stomach opens at the wrong times, allowing milk and food to come back up into the the tube in the throat (esophagus). Symptoms of GER can include:   Crying as if in discomfort.  Waking up frequently at night.   Problems swallowing.   Frequent red or sore throat.   Signs of asthma, bronchitis, wheezing or problems breathing.   Projectile vomiting.   Arching of the back as if in severe pain.  Slow weight gain.   Gagging or choking.   Frequent hiccupping or burping.   Severe spitting up, or spitting up after every  feeding, or hours after eating.  Refusal to eat.   Many healthy babies might have some of these symptoms and do not have GER. But there are babies who might only have a few of these symptoms and have a severe case of GER. Not all babies with GER spit up or vomit.  Some babies with GER do not have a serious medical problem. But caring for them can be hard since they tend to be very fussy and wake up frequently at night. More severe cases of GER may need to be treated with medication if the baby, in addition to spitting up, also refuses to nurse, gains weight poorly or is losing weight, or has periods of gagging or choking.   If your baby spits up after every feeding and has any of the other symptoms mentioned above, it is best to see his or her doctor for a correct diagnosis. Other than GER, your baby could have another condition that needs treatment. If there are no other signs of illness, he/she could just be sensitive to a food in your diet or a medication he/she is receiving. If your baby has GER, it is important to try to continue to breastfeed since breast milk still is more easily digested than formula. Try smaller, more frequent feedings, thorough burping, and putting the baby in an upright position during and after feedings.   CLEFT PALATE AND CLEFT LIP  Cleft palate and cleft lip are some of the most common birth defects that happen as a baby is developing in the womb. A cleft, or opening, in either the palate or lip can happen together or separately and both can be corrected through surgery. Both conditions can prevent babies from breastfeeding because a baby cannot form a good seal around the nipple and areola with his or her mouth, or get milk out of the breast well.   Cleft palate can happen on one or both sides of a baby's mouth and be partial or complete. Right after birth, a mother whose baby has a cleft palate can try to breastfeed her baby, and she can start expressing her milk right away to keep up her supply. Even if her baby cannot latch on well to her breast, the baby can be fed breast milk by cup. In some hospitals, babies with cleft palate are fitted with a mouthpiece called an obturator. This fits into the cleft and seals it for easier feeding. The baby should be able to exclusively breastfeed after surgery.   Cleft lip can happen on one or both sides of a  baby's lip. But a mother can try different breastfeeding positions and use her thumb or breast to help fill in the opening left by the lip to form a seal around the breast. With cleft lip repair, breastfeeding may only have to be stopped for a few hours.   If your baby is born with a cleft palate or cleft lip, talk with a lactation consultant in the hospital for assistance as soon as possible. Human milk and early breastfeeding is still best for your baby's health.  TWINS OR MULTIPLES Mothers of twins or multiples might feel overwhelmed with the idea of breastfeeding more than one baby at a time. The benefits of human milk to both these mothers and babies are the same as for all mothers and babies. When breastfeeding twins, your breast milk will increase to the amount the babies will need. You will have to take in more calories and liquids when nursing  twins. If the babies are premature and unable to nurse, you can pump your breasts and freeze the milk until the babies are ready to nurse. But mothers of multiples get even more benefits from breastfeeding:  Their uterus contracts, which is helpful because it has stretched even more to hold more than one baby.   Hormones are released that relax the mother, which is helpful with the added stress of caring for more than one baby.   Eight to ten hours per week are saved because there is no need to prepare formula or bottles and the mother's milk is available right away.   Breastfeeding early and often for a mother of multiples is important to keep up her milk supply. A good latch-on for each baby is important to avoid sore nipples. Many mothers find that it is easier to nurse the babies together rather than separately, and that it gets easier as the babies get older. There are many breastfeeding holds that make it easier to nurse more than one baby at a time. If you are having multiples, talk with a lactation consultant about more ways you can successfully breastfeed your babies.  BREASTFEEDING DURING PREGNANCY While most mothers who are nursing a toddler stop breastfeeding if they find out they are pregnant, it is an individual choice to decide whether to keep breastfeeding during the pregnancy. It is not unsafe for the unborn child if you continue to breastfeed an older child during this time. But, if you are having some problems in your pregnancy such as uterine pain or bleeding, a history of preterm labor or problems gaining weight during pregnancy, your doctor may advise you to wean. Your child also may decide to wean on his or her own because pregnancy changes the amount and flavor of your milk. Some women also choose to wean at this time because they have nipple soreness caused by pregnancy hormones, are nauseous, tire more easily, or find that their growing stomachs make breastfeeding uncomfortable.  You will need more calories when you breastfeed while pregnant. Your milk production usually slows down around the fourth month of pregnancy.  BREASTFEEDING AFTER BREAST SURGERY   If you have had breast surgery, including breast implants, you might be worried about whether you will be able to breastfeed. The most important things that affect whether you can produce enough milk for your baby are how your surgery was done and where your incisions are, and the reasons for your surgery. For example, women who have had incisions in the fold under the breasts are less likely  to have problems producing milk than women who have had incisions around or across the areola. Incisions around the areola can cut into milk ducts and nerves, where milk is produced and travels. Women who have had breast surgery to augment breasts that never fully developed may not have enough glands to produce a full milk supply.   If you had breast surgery and are worried about how it will affect breastfeeding, talk with a Advertising copywriter. If you are planning breast surgery and are worried about how it will affect breastfeeding, talk with your surgeon about ways he or she can preserve as much of the breast tissue and milk ducts as possible.  INDUCING LACTATION  Many mothers who adopt want to breastfeed their babies and can do it successfully with some help. It is successful 1/3 to 1/2 of the time it is tried. Many will need to supplement their breast milk with donated breast milk or infant formula. But some adoptive mothers can breastfeed exclusively, especially if they have been pregnant before. Lactation is a hormonal response to a physical action. So the stimulation of the baby nursing causes the body to see a need for and produce milk. The more the baby nurses, the more a woman's body will produce milk.   Beginning a combination of hormone treatment several months before the baby is born should help facilitate lactation in many  cases.   One thing you can do to prepare is to pump every 3 hours around the clock for two to three weeks before your baby arrives. Or you can wait until the baby arrives and starts to nurse. Breast milk can be frozen until you are ready to nurse the baby. A supplemental nursing system (SNS) or a lactation aid can help ensure that your baby gets enough nutrition and that your breasts are stimulated to produce milk at the same time.   If you are an adoptive mother who wants to breastfeed, you should see both a Advertising copywriter and your doctor for help in establishing an initial milk supply.  FOR MORE INFORMATION, CONTACT [employer] www.llli.org Document Released: 12/18/2005 Document Re-Released: 09/20/2009 [facility] Patient Information 2011 [place], [place].

## 2011-03-14 NOTE — Progress Notes (Signed)
Patient is here to discuss birth control and follow up for her Post Partum depression.  She is also having a decrease in her milk supply.   Her baby is currently been removed from the home and is staying with her grandparents pending an investigation due to a fall.  She will be going through psychological evaluation and family planning meetings to determine when she can return to home.  They are investigating both her and her husband but focusing on him.  She does need a note concerning her decreased milk production and that during her pregnancy she followed up appropriately and we did not see any signs that might indicate a home problem.

## 2011-04-07 LAB — URINALYSIS, ROUTINE W REFLEX MICROSCOPIC
Glucose, UA: NEGATIVE
Ketones, ur: NEGATIVE
pH: 7.5

## 2011-04-07 LAB — WET PREP, GENITAL: Trich, Wet Prep: NONE SEEN

## 2011-04-07 LAB — PREGNANCY, URINE: Preg Test, Ur: NEGATIVE

## 2011-04-26 ENCOUNTER — Ambulatory Visit (INDEPENDENT_AMBULATORY_CARE_PROVIDER_SITE_OTHER): Payer: Medicaid Other | Admitting: Family Medicine

## 2011-04-26 ENCOUNTER — Other Ambulatory Visit (HOSPITAL_COMMUNITY)
Admission: RE | Admit: 2011-04-26 | Discharge: 2011-04-26 | Disposition: A | Discharge status: Home / Self Care | Payer: Medicaid Other | Source: Ambulatory Visit | Attending: Family Medicine | Admitting: Family Medicine

## 2011-04-26 ENCOUNTER — Encounter: Payer: Self-pay | Admitting: Family Medicine

## 2011-04-26 DIAGNOSIS — Z01419 Encounter for gynecological examination (general) (routine) without abnormal findings: Secondary | ICD-10-CM | POA: Insufficient documentation

## 2011-04-26 DIAGNOSIS — F419 Anxiety disorder, unspecified: Secondary | ICD-10-CM

## 2011-04-26 DIAGNOSIS — Z309 Encounter for contraceptive management, unspecified: Secondary | ICD-10-CM

## 2011-04-26 HISTORY — DX: Anxiety disorder, unspecified: F41.9

## 2011-04-26 MED ORDER — CITALOPRAM HYDROBROMIDE 20 MG PO TABS: 20.0000 mg | ORAL_TABLET | Freq: Every day | ORAL | Status: DC

## 2011-04-26 NOTE — Patient Instructions (Addendum)
Anxiety and Panic Attacks Your caregiver has informed you that you are having an anxiety or panic attack. There may be many forms of this. Most of the time these attacks come suddenly and without warning. They come at any time of day, including periods of sleep, and at any time of life. They may be strong and unexplained. Although panic attacks are very scary, they are physically harmless. Sometimes the cause of your anxiety is not known. Anxiety is a protective mechanism of the body in its fight or flight mechanism. Most of these perceived danger situations are actually nonphysical situations (such as anxiety over losing a job). CAUSES The causes of an anxiety or panic attack are many. Panic attacks may occur in otherwise healthy people given a certain set of circumstances. There may be a genetic cause for panic attacks. Some medications may also have anxiety as a side effect. SYMPTOMS Some of the most common feelings are:  Intense terror.  Dizziness, feeling faint.   Hot and cold flashes.   Fear of going crazy.   Feelings that nothing is real.   Sweating.   Shaking.   Chest pain or a fast heartbeat (palpitations).  Smothering, choking sensations.   Feelings of impending doom and that death is near.   Tingling of extremities, this may be from over breathing.   Altered reality (derealization).   Being detached from yourself (depersonalization).   Several symptoms can be present to make up anxiety or panic attacks. DIAGNOSIS The evaluation by your caregiver will depend on the type of symptoms you are experiencing. The diagnosis of anxiety or pain attack is made when no physical illness can be determined to be a cause of the symptoms. TREATMENT Treatment to prevent anxiety and panic attacks may include:  Avoidance of circumstances that cause anxiety.   Reassurance and relaxation.   Regular exercise.   Relaxation therapies, such as yoga.   Psychotherapy with a psychiatrist  or therapist.   Avoidance of caffeine, alcohol and illegal drugs.   Prescribed medication.  SEEK IMMEDIATE MEDICAL CARE IF:  You experience panic attack symptoms that are different than your usual symptoms.   You have any worsening or concerning symptoms.  Document Released: 06/26/2005 Document Re-Released: 12/14/2009 [facility] Patient Information 2011 [place], [place].Preventative Care for Adults - Female Studies show that half of deaths in the [place] today result from unhealthy lifestyle practices. This includes ignoring preventive care suggestions. Preventive health guidelines for women include the following key practices:  A routine yearly physical is a good way to check with your primary caregiver about your health and preventive screening. It is a chance to share any concerns and updates on your health, and to receive a thorough all-over exam.   If you smoke cigarettes, find out from your caregiver how to quit. It can literally save your life, no matter how long you have been a tobacco user. If you do not use tobacco, never start.   Maintain a healthy diet and normal weight. Increased weight leads to problems with blood pressure and diabetes. Decrease saturated fat in your diet and increase regular exercise. Eat a variety of foods, including fruit, vegetables, animal or vegetable protein (meat, fish, chicken, and eggs, or beans, lentils, and tofu), and grains, such as rice. Get information about proper diet from your caregiver, if needed.   Aerobic exercise helps maintain good heart health. The CDC and the [employer] of Sports Medicine recommend 30 minutes of moderate-intensity exercise (a brisk walk that increases  your heart rate and breathing) on most days of the week. Ongoing high blood pressure should be treated with medicines, if weight loss and exercise are not effective.   Avoid smoking, drinking too much alcohol (more than two drinks per day), and use of street drugs.  Do not share needles with anyone. Ask for professional help if you need support or instructions about stopping the use of alcohol, cigarettes, or drugs.   Maintain normal blood lipids and cholesterol, by minimizing your intake of saturated fat. Eat a well rounded diet, with plenty of fruit and vegetables. The [employer] of Health encourage women to eat 5-9 servings of fruit and vegetables each day. Your caregiver can give instructions to help you keep your risk of heart disease or stroke low. High blood pressure causes heart disease and increases risk of stroke. Blood pressure should be checked every 1-2 years, from age 55 onward.   Blood tests for high cholesterol, which causes heart and vessel disease, should begin at age 19 and be repeated every 5 years, if test results are normal. (Repeat tests more often if results are high.)   Diabetes screening involves taking a blood sample to check your blood sugar level, after a fasting period. This is done once every 3 years, after age 56, if test results are normal.   Breast cancer screening is essential to preventive care for women. All women age 72 and older should perform a breast self-exam every month. At age 40 and older, women should have their caregiver complete a breast exam each year. Women at ages 38-50 should have a mammogram (x-ray film) of the breasts each year. Your caregiver can discuss when to start your yearly mammograms.   Cervical cancer screening includes taking a Pap smear (sample of cells examined under a microscope) from the cervix (end of the uterus). It also includes testing for HPV (Human Papilloma Virus, which can cause cervical cancer). Screening and a pelvic exam should begin at age 23, or 3 years after a woman becomes sexually active. Screening should occur every year, with a Pap smear but no HPV testing, up to age 56. After age 85, you should have a Pap smear every 3 years with HPV testing, if no HPV was found previously.    Colon cancer can be detected, and often prevented, long before it is life threatening. Most routine colon cancer screening begins at the age of 82. On a yearly basis, your caregiver may provide easy-to-use take-home tests to check for hidden blood in the stool. Use of a small camera at the end of a tube, to directly examine the colon (sigmoidoscopy or colonoscopy), can detect the earliest forms of colon cancer and can be life saving. Talk to your caregiver about this at age 32, when routine screening begins. (Screening is repeated every 5 years, unless early forms of pre-cancerous polyps or small growths are found.)   Practice safe sex. Use condoms. Condoms are used for birth control and to reduce the spread of sexually transmitted infections (STIs). Unsafe sex is sexual activity without the use of safeguards, such as condoms and avoidance of high-risk acts, to reduce the chances of getting or spreading STIs. STIs include gonorrhea (the clap), chlamydia, syphilis, trichimonas, herpes, HPV (human papilloma virus) and HIV (human immunodeficiency virus), which causes AIDS. Herpes, HIV, and HPV are viral illnesses that have no cure. They can result in disability, cancer, and death.   HPV causes cancer of the cervix, and other infections that can  be transmitted from person to person. There is a vaccine for HPV, and females should get immunized between the ages of 61 and 109. It requires a series of 3 shots.   Osteoporosis is a disease in which the bones lose minerals and strength as we age. This can result in serious bone fractures. Risk of osteoporosis can be identified using a bone density scan. Women ages 42 and over should discuss this with their caregivers, as should women after menopause who have other risk factors. Ask your caregiver whether you should be taking a calcium supplement and Vitamin D, to reduce the rate of osteoporosis.   Menopause can be associated with physical symptoms and risks. Hormone  replacement therapy is available to decrease these. You should talk to your caregiver about whether starting or continuing to take hormones is right for you.   Use sunscreen with SPF (skin protection factor) of 15 or more. Apply sunscreen liberally and repeatedly throughout the day. Being outside in the sun, when your shadow is shorter than you are, means you are being exposed to sun at greater intensity. Lighter skinned people are at a greater risk of skin cancer. Wear sunglasses, to protect your eyes from too much damaging sunlight (which can speed up cataract formation).   Once a month, do a whole body skin exam or review, using a mirror to look at your back. Notify your caregiver of changes in moles, especially if there are changes in shapes, colors, irregular border, a size larger than a pencil eraser, or new moles develop.   Keep carbon monoxide and smoke detectors in your home, and functioning, at all times. Change the batteries every 6 months, or use a model that plugs into the wall.   Stay up to date with your tetanus shots and other required immunizations. A booster for tetanus should be given every 10 years. Be sure to get your flu shot every year, since 5%-20% of the [place] population comes down with the flu. The composition of the flu vaccine changes each year, so being vaccinated once is not enough. Get your shot in the fall, before the flu season peaks. The table below lists important vaccines to get. Other vaccines to consider include for Hepatitis A virus (to prevent a form of infection of the liver, by a virus acquired from food), Varicella Zoster (a virus that causes shingles), and Meninogoccal (against bacteria which cause a form of meningitis).   Brush your teeth twice a day with fluoride toothpaste, and floss once a day. Good oral hygiene prevents tooth decay and gum disease, which can be painful and can cause other health problems. Visit your dentist for a routine oral and dental check  up and preventive care every 6-12 months.   The Body Mass Index or BMI is a way of measuring how much of your body is fat. Having a BMI above 27 increases the risk of heart disease, diabetes, hypertension, stroke and other problems related to obesity. Your caregiver can help determine your BMI, and can develop an exercise and dietary program to help you achieve or maintain this measurement at a healthy level.   Wear seat belts whenever you are in a vehicle, whether as passenger or driver, and even for short drives of a few minutes.   If you bicycle, wear a helmet at all times.  Preventative Care for Adult Women  Preventative Services Ages 47-39 Ages 54-64 Ages 61 and over  Health risk assessment and lifestyle counseling.  Blood pressure check.** Every 1-2 years Every 1-2 years Every 1-2 years  Total cholesterol check including HDL.** Every 5 years beginning at age 82 Every 5 years beginning at age 38, or more often if risk is high Every 5 years through age 4, then optional  Breast self exam. Monthly in all women ages 32 and older Monthly Monthly  Clinical breast exam.** Every 3 years beginning at age 13 Every year Every year  Mammogram.**  Every year beginning at age 40, optional from age 78-49 (discuss with your caregiver). Every year until age 36, then optional  Pap Smear** and HPV Screening. Every year from ages 19 through 35 Every 3 years from ages 34 through 45, if HPV is negative Optional; talk with your caregiver  Flexible sigmoidoscopy** or colonoscopy.**   Every 5 years beginning at age 32 Every 5 years until age 7; then optional  FOBT (fecal occult blood test) of stool.  Every year beginning at age 56 Every year until 62; then optional  Skin self-exam. Monthly Monthly Monthly  Tetanus-diphtheria (Td) immunization. Every 10 years Every 10 years Every 10 years  Influenza immunization.** Every year Every year Every year  HPV immunization. Once between the ages of 26 and 84       Pneumococcal immunization.** Optional Optional Every 5 years  Hepatitis B immunization.** Series of 3 immunizations  (if not done previously, usually given at 0, 1 to 2, and 4 to 6 months)  Check with your caregiver, if vaccination not previously given Check with your caregiver, if vaccination not previously given  ** Family history and personal history of risk and conditions may change your caregiver's recommendations.  Document Released: 08/22/2001 Document Re-Released: 09/20/2009 [facility] Patient Information 2011 [place], [place].IMPORTANT: HOW TO USE THIS INFORMATION:  This is a summary and does NOT have all possible information about this product. This information does not assure that this product is safe, effective, or appropriate for you. This information is not individual medical advice and does not substitute for the advice of your health care professional. Always ask your health care professional for complete information about this product and your specific health needs.    LEVONORGESTREL-RELEASING IMPLANT - INTRAUTERINE ([id number])    COMMON BRAND NAME(S): Mirena    USES:  This product is a small, flexible device that is placed in the womb (uterus) to prevent pregnancy. It is used in women who desire reversible birth control that works for a long time (up to 5 years). The device works by slowly releasing a hormone (levonorgestrel) that is similar to a certain substance made by a woman's body. This product is only intended for women who have previously given birth and have only one sexual partner. It is not meant for women with a history of certain infections/conditions (e.g., pelvic inflammatory disease, sexually transmitted disease, a certain problem pregnancy called ectopic pregnancy). For more information, consult your doctor. The use of this medication device does not protect you or your partner against sexually transmitted diseases (e.g., HIV, gonorrhea). Carefully read all of  the information provided by your doctor, and ask any questions you may have about this product or other birth control methods that may be right for you.    HOW TO USE:  Read the Patient Information Leaflet provided by your pharmacist before this medication device is inserted and each time it is re-inserted. The leaflet contains very important information about side effects and when it is important to call your doctor. If you have any questions,  consult your doctor or pharmacist. This product is inserted into your uterus by a properly trained health care professional, usually once every 5 years or as determined by your doctor. The medication in the device is slowly released into the body over a 5-year period. Have a follow-up appointment 4-12 weeks after insertion of this product to check that it is still correctly in place. If you still desire birth control after 5 years, the medication device may be replaced with a new one. The medication device may also be removed at any time by a properly trained health care professional. Learn all the instructions on how and when to check this product and its proper positioning in your body, and make sure you understand the problems that may occur with this product. See also Precautions section.    SIDE EFFECTS:  Irregular vaginal bleeding (e.g., spotting), cramps, headache, nausea, breast pain, acne, rash, hair loss, weight gain, or decreased interest in sex may occur. If any of these effects persist or worsen, tell your doctor promptly. Remember that your doctor has prescribed this medication device because he or she has judged that the benefit to you is greater than the risk of side effects. Many people using this medication device do not have serious side effects. Tell your doctor immediately if any of these serious side effects occur: lack of menstrual period, unexplained fever, chills, trouble breathing, mental/mood changes (e.g., depression, nervousness), vaginal  swelling/itching, painful intercourse. Tell your doctor immediately if any of these unlikely but serious side effects occur: migraine/severe headache, vomiting, tiredness, fast/pounding heartbeat. Tell your doctor immediately if any of these highly unlikely but very serious side effects occur: prolonged or heavy vaginal bleeding, unusual vaginal discharge/odor, vaginal sores, abdominal/pelvic pain or tenderness, lumps in the breast, yellowing eyes/skin, dark urine, persistent nausea, trouble urinating. A very serious allergic reaction to this drug is rare. However, seek immediate medical attention if you notice any of the following symptoms of a serious allergic reaction: rash, itching/swelling (especially of the face/tongue/throat), severe dizziness, trouble breathing. This is not a complete list of possible side effects. If you notice other effects not listed above, contact your doctor or pharmacist. In the [place] - Call your doctor for medical advice about side effects. You may report side effects to FDA at 1-800-FDA-1088. In [place] - Call your doctor for medical advice about side effects. You may report side effects to [facility] [place] at [phone].    PRECAUTIONS:  Before using this medication device, tell your doctor or pharmacist if you are allergic to levonorgestrel, or to any other progestins (e.g., norethindrone, desogestrel); or if you have any other allergies. This product may contain inactive ingredients, which can cause allergic reactions or other problems. Talk to your pharmacist for more details. This medication device should not be used if you have certain medical conditions. Before using this product, consult your doctor or pharmacist if you have: current known or suspected pregnancy, previous ectopic pregnancy, uterus problems (e.g., cancer, endometriosis, fibroids, pelvic inflammatory disease-PID), other IUD (intrauterine device) still in place, vaginal problems (e.g., infection), breast cancer,  liver disease/tumors, any condition that affects your immune system (e.g., AIDS, leukemia). Before using this product, tell your doctor your medical history, especially of: bleeding problems (e.g., menstrual changes, clotting problems), heart problems (e.g., congenital valve conditions), high blood pressure, migraine headaches, stroke, diabetes. If you have diabetes, this medication may make it harder to control your blood sugar levels. Monitor your blood sugar regularly as directed by your doctor. Tell  your doctor the results and any symptoms such as increased thirst/urination. Your anti-diabetic medication or diet may need to be adjusted. This medication device may sometimes come out by itself or move out of place. This may result in unwanted pregnancy or other problems. After each menstrual period, check to make sure it is in the right place. Talk to your doctor about how to check your device. If it comes out or you cannot feel its threads, call your doctor promptly, and use a backup birth control method such as condoms. If you or partner has any other sexual partners, this medication device may no longer be a good choice for pregnancy prevention. If you or your partner becomes HIV positive, or if you think you may have been exposed to any sexually transmitted disease, contact your doctor immediately. You should consider having this device removed. This medication device must not be used during pregnancy. If you become pregnant or think you may be pregnant, tell your doctor immediately. If you have just given birth and are not breast-feeding, or if you have had a pregnancy loss or abortion after the 3 months of pregnancy, wait at least 6 weeks (or as directed by your doctor) before using this medication device. Consult your doctor about the problems that may occur during pregnancy while using this product. Levonorgestrel passes into breast milk. Consult your doctor before breast-feeding.    DRUG INTERACTIONS:   Your doctor or pharmacist may already be aware of any possible drug interactions and may be monitoring you for them. Do not start, stop, or change the dosage of any medicine before checking with them first. Before using this medication device, tell your doctor of all prescription and nonprescription medications you may use, especially of: "blood thinners" (e.g., warfarin), birth control taken by mouth or applied to the skin (patch), certain drug used for varicose vein treatment (sodium tetradecyl sulfate), drugs that affect your immune response (e.g., corticosteroids such as prednisone). This document does not contain all possible interactions. Therefore, before using this product, tell your doctor or pharmacist of all the products you use. Keep a list of all your medications with you, and share the list with your doctor and pharmacist.    OVERDOSE:  Overdose with this medication is very unlikely because of the way the drug is released from this device. Consult your doctor or pharmacist for more information.    NOTES:  Do not share this medication with others. Keep all appointments with your doctor and the laboratory. You should have regular complete physical exams including blood pressure, breast exam, pelvic exam, and screening for cervical cancer (Pap smear). Follow your doctor's instructions for examining your own breasts, and report any lumps immediately. Consult your doctor for more details.    MISSED DOSE:  Not applicable.    STORAGE:  Before use, store at room temperature at 77 degrees F (25 degrees C) away from light and moisture. Brief storage between 59-86 degrees F (15-30 degrees C) is permitted. Keep all medications and medical devices away from children and pets. Do not flush medications down the toilet or pour them into a drain unless instructed to do so. Properly discard this product when it is expired or no longer needed. Consult your pharmacist or local waste disposal company for more  details about how to safely discard your product.    Information last revised June 2010 Copyright(c) 2010 [employer].

## 2011-04-26 NOTE — Progress Notes (Signed)
  Subjective:     Kerry [name] is a 25 y.o. female and is here for a comprehensive physical exam. The patient reports no problems.  Having some anxiety about life.  She never started her Celexa because of potential side fx.  She would like to start it now.  Interested in Mirena secondary to abnl bleeding on Micronor.  History   Social History  . Marital Status: Married    Spouse Name: N/A    Number of Children: N/A  . Years of Education: N/A   Occupational History  . Not on file.   Social History Main Topics  . Smoking status: Never Smoker   . Smokeless tobacco: Never Used  . Alcohol Use: Yes     rare  . Drug Use: No  . Sexually Active: Yes -- Female partner(s)    Birth Control/ Protection: OCP   Other Topics Concern  . Not on file   Social History Narrative  . No narrative on file   Health Maintenance  Topic Date Due  . Pap Smear  07/27/2005  . Tetanus/tdap  07/27/2006  . Influenza Vaccine  04/10/2011    The following portions of the patient's history were reviewed and updated as appropriate: allergies, current medications, past family history, past medical history, past social history, past surgical history and problem list.  Review of Systems A comprehensive review of systems was negative.   Objective:    BP 108/87  Pulse 89  Ht 5\' 3"  (1.6 m)  Wt 191 lb (86.637 kg)  BMI 33.83 kg/m2  LMP 03/24/2011 General appearance: alert, cooperative and appears stated age Head: Normocephalic, without obvious abnormality, atraumatic Neck: no adenopathy, supple, symmetrical, trachea midline and thyroid not enlarged, symmetric, no tenderness/mass/nodules Lungs: clear to auscultation bilaterally Breasts: normal appearance, no masses or tenderness Heart: regular rate and rhythm, S1, S2 normal, no murmur, click, rub or gallop Abdomen: soft, non-tender; bowel sounds normal; no masses,  no organomegaly Pelvic: cervix normal in appearance, external genitalia normal, no adnexal  masses or tenderness, no cervical motion tenderness, uterus normal size, shape, and consistency and vagina normal without discharge Extremities: extremities normal, atraumatic, no cyanosis or edema Pulses: 2+ and symmetric Skin: Skin color, texture, turgor normal. No rashes or lesions Lymph nodes: Cervical, supraclavicular, and axillary nodes normal. Neurologic: Grossly normal    Assessment:    Healthy female exam.  Birth control consult.     Plan:    Pap smear Refill her Celexa for anxiety Considering the Mirena. See After Visit Summary for Counseling Recommendations

## 2011-05-02 ENCOUNTER — Ambulatory Visit: Payer: Medicaid Other | Admitting: Family Medicine

## 2011-07-27 ENCOUNTER — Other Ambulatory Visit: Payer: Self-pay | Admitting: Gynecology

## 2011-07-27 DIAGNOSIS — B373 Candidiasis of vulva and vagina: Secondary | ICD-10-CM

## 2011-07-27 MED ORDER — FLUCONAZOLE 150 MG PO TABS: 150.0000 mg | ORAL_TABLET | Freq: Once | ORAL | Status: AC

## 2011-09-04 ENCOUNTER — Ambulatory Visit (INDEPENDENT_AMBULATORY_CARE_PROVIDER_SITE_OTHER): Payer: Medicaid Other | Admitting: *Deleted

## 2011-09-04 DIAGNOSIS — Z111 Encounter for screening for respiratory tuberculosis: Secondary | ICD-10-CM

## 2011-09-06 ENCOUNTER — Other Ambulatory Visit: Payer: Medicaid Other | Admitting: *Deleted

## 2011-09-06 DIAGNOSIS — Z111 Encounter for screening for respiratory tuberculosis: Secondary | ICD-10-CM

## 2011-09-06 NOTE — Progress Notes (Signed)
Patients tb rest reads negative.

## 2011-12-18 ENCOUNTER — Telehealth: Payer: Self-pay | Admitting: *Deleted

## 2011-12-18 DIAGNOSIS — N390 Urinary tract infection, site not specified: Secondary | ICD-10-CM

## 2011-12-18 MED ORDER — NITROFURANTOIN MONOHYD MACRO 100 MG PO CAPS: 100.0000 mg | ORAL_CAPSULE | Freq: Two times a day (BID) | ORAL | Status: AC

## 2011-12-18 NOTE — Telephone Encounter (Signed)
Patient is having increased pressure and frequency with urination.  She is unable to come to office and would like [place] to call it in.

## 2012-03-14 ENCOUNTER — Telehealth: Payer: Self-pay | Admitting: *Deleted

## 2012-03-14 DIAGNOSIS — IMO0001 Reserved for inherently not codable concepts without codable children: Secondary | ICD-10-CM

## 2012-03-14 MED ORDER — NORGESTIMATE-ETH ESTRADIOL 0.25-35 MG-MCG PO TABS: 1.0000 | ORAL_TABLET | Freq: Every day | ORAL | Status: DC

## 2012-03-14 NOTE — Telephone Encounter (Signed)
Patient is no longer breast feeding and would like to switch to a regular birth control pill, as she is currently on Micronor.  Orthocyclen has been called in for her.

## 2012-07-07 ENCOUNTER — Emergency Department: Payer: Self-pay | Admitting: Emergency Medicine

## 2012-07-07 LAB — BASIC METABOLIC PANEL
Calcium, Total: 8.8 mg/dL (ref 8.5–10.1)
Co2: 27 mmol/L (ref 21–32)
Creatinine: 0.78 mg/dL (ref 0.60–1.30)
EGFR (African American): >60
EGFR (Non-African Amer.): >60
Potassium: 4.1 mmol/L (ref 3.5–5.1)
Sodium: 139 mmol/L (ref 136–145)

## 2012-07-07 LAB — URINALYSIS, COMPLETE
Blood: NEGATIVE
Glucose,UR: NEGATIVE mg/dL (ref 0–75)
Ketone: NEGATIVE
Nitrite: NEGATIVE
Protein: NEGATIVE
Specific Gravity: 1.023 (ref 1.003–1.030)
WBC UR: 1 /HPF (ref 0–5)

## 2012-07-07 LAB — CBC
HGB: 13.2 g/dL (ref 12.0–16.0)
MCH: 32.2 pg (ref 26.0–34.0)
MCHC: 33.8 g/dL (ref 32.0–36.0)
Platelet: 257 10*3/uL (ref 150–440)

## 2012-07-10 HISTORY — PX: OTHER SURGICAL HISTORY: SHX169

## 2012-07-11 ENCOUNTER — Encounter: Payer: Self-pay | Admitting: Obstetrics & Gynecology

## 2012-07-11 ENCOUNTER — Ambulatory Visit (INDEPENDENT_AMBULATORY_CARE_PROVIDER_SITE_OTHER): Payer: Medicaid Other | Admitting: Obstetrics & Gynecology

## 2012-07-11 ENCOUNTER — Other Ambulatory Visit (HOSPITAL_COMMUNITY)
Admission: RE | Admit: 2012-07-11 | Discharge: 2012-07-11 | Disposition: A | Discharge status: Home / Self Care | Payer: Medicaid Other | Source: Ambulatory Visit | Attending: Obstetrics & Gynecology | Admitting: Obstetrics & Gynecology

## 2012-07-11 ENCOUNTER — Ambulatory Visit: Payer: Medicaid Other | Admitting: Obstetrics & Gynecology

## 2012-07-11 VITALS — BP 121/87 | HR 94 | Ht 63.0 in | Wt 175.0 lb

## 2012-07-11 DIAGNOSIS — R87810 Cervical high risk human papillomavirus (HPV) DNA test positive: Secondary | ICD-10-CM | POA: Insufficient documentation

## 2012-07-11 DIAGNOSIS — F411 Generalized anxiety disorder: Secondary | ICD-10-CM

## 2012-07-11 DIAGNOSIS — Z3009 Encounter for other general counseling and advice on contraception: Secondary | ICD-10-CM

## 2012-07-11 DIAGNOSIS — Z113 Encounter for screening for infections with a predominantly sexual mode of transmission: Secondary | ICD-10-CM | POA: Insufficient documentation

## 2012-07-11 DIAGNOSIS — F419 Anxiety disorder, unspecified: Secondary | ICD-10-CM

## 2012-07-11 DIAGNOSIS — IMO0001 Reserved for inherently not codable concepts without codable children: Secondary | ICD-10-CM

## 2012-07-11 DIAGNOSIS — Z1151 Encounter for screening for human papillomavirus (HPV): Secondary | ICD-10-CM | POA: Insufficient documentation

## 2012-07-11 DIAGNOSIS — Z309 Encounter for contraceptive management, unspecified: Secondary | ICD-10-CM

## 2012-07-11 DIAGNOSIS — Z01419 Encounter for gynecological examination (general) (routine) without abnormal findings: Secondary | ICD-10-CM | POA: Insufficient documentation

## 2012-07-11 MED ORDER — CITALOPRAM HYDROBROMIDE 20 MG PO TABS: 20.0000 mg | ORAL_TABLET | Freq: Every day | ORAL | Status: DC

## 2012-07-11 MED ORDER — NORGESTIMATE-ETH ESTRADIOL 0.25-35 MG-MCG PO TABS: 1.0000 | ORAL_TABLET | Freq: Every day | ORAL | Status: DC

## 2012-07-11 NOTE — Progress Notes (Signed)
Patient ID: Kerry [name], female   DOB: 19-May-1986, 27 y.o.   MRN: [id number]  Chief Complaint  Patient presents with  . Gynecologic Exam    HPI Kerry [name] is a 27 y.o. female.  Plans yearly exam, refill OCP. Breast tenderness for a few weeks, no discharge or mass HPI  Past Medical History  Diagnosis Date  . Post partum depression     History reviewed. No pertinent past surgical history.  History reviewed. No pertinent family history.  Social History History  Substance Use Topics  . Smoking status: Never Smoker   . Smokeless tobacco: Never Used  . Alcohol Use: Yes     Comment: rare    No Known Allergies  Current Outpatient Prescriptions  Medication Sig Dispense Refill  . citalopram (CELEXA) 20 MG tablet Take 1 tablet (20 mg total) by mouth daily.  30 tablet  12  . norgestimate-ethinyl estradiol (ORTHO-CYCLEN,SPRINTEC,PREVIFEM) 0.25-35 MG-MCG tablet Take 1 tablet by mouth daily.  1 Package  11  . pseudoephedrine (SUDAFED) 30 MG tablet Take 30 mg by mouth every 4 (four) hours as needed.          Review of Systems Review of Systems  Constitutional: Negative for activity change.  Cardiovascular: Negative.   Gastrointestinal: Negative.   Genitourinary: Negative for difficulty urinating, menstrual problem and dyspareunia.  Psychiatric/Behavioral: Negative for suicidal ideas and sleep disturbance. The patient is not nervous/anxious.        Symptoms she attributes to need for better compliance with her medications    Blood pressure 121/87, pulse 94, height 5\' 3"  (1.6 m), weight 175 lb (79.379 kg), last menstrual period 06/18/2012, not currently breastfeeding.  Physical Exam Physical Exam  Constitutional: She is oriented to person, place, and time. She appears well-developed and well-nourished. No distress.  HENT:  Head: Normocephalic and atraumatic.  Eyes: Pupils are equal, round, and reactive to light.  Neck: Normal range of motion. Neck supple. No  thyromegaly present.  Cardiovascular: Normal rate, regular rhythm and normal heart sounds.   Pulmonary/Chest: Effort normal and breath sounds normal. No respiratory distress.       Breasts no masses or tenderness or axillary mass  Abdominal: Soft. She exhibits no mass. There is no tenderness.  Genitourinary: Vagina normal and uterus normal. No vaginal discharge found.       Pap done, no mass  Musculoskeletal: She exhibits no edema.  Lymphadenopathy:    She has no cervical adenopathy.  Neurological: She is alert and oriented to person, place, and time.  Skin: Skin is warm and dry.  Psychiatric: She has a normal mood and affect. Her behavior is normal.    Data Reviewed Meds, previous notes  Assessment    Normal well woman exam H/o anxiety and depression On OCP      Plan    Reorder Celexa and OCP Yearly exam        ARNOLD,JAMES 07/11/2012, 1:41 PM

## 2012-07-11 NOTE — Progress Notes (Signed)
Patient is here for physical exam, she is having breast pain off and on for one month, hurts when she picks something up etc..Marland KitchenMarland Kitchen

## 2012-07-12 ENCOUNTER — Ambulatory Visit: Payer: Medicaid Other | Admitting: Obstetrics & Gynecology

## 2012-07-12 NOTE — Addendum Note (Signed)
Addended by: Barbara Cower on: 07/12/2012 09:45 AM   Modules accepted: Orders

## 2012-07-16 ENCOUNTER — Ambulatory Visit: Payer: Medicaid Other | Admitting: Family Medicine

## 2013-02-03 ENCOUNTER — Ambulatory Visit (INDEPENDENT_AMBULATORY_CARE_PROVIDER_SITE_OTHER): Payer: Medicaid Other | Admitting: *Deleted

## 2013-02-03 DIAGNOSIS — Z23 Encounter for immunization: Secondary | ICD-10-CM

## 2013-04-07 ENCOUNTER — Ambulatory Visit: Payer: Medicaid Other | Admitting: *Deleted

## 2013-07-04 ENCOUNTER — Ambulatory Visit: Payer: Self-pay | Admitting: Podiatry

## 2013-07-04 LAB — CBC WITH DIFFERENTIAL/PLATELET
Eosinophil #: 0.1 10*3/uL (ref 0.0–0.7)
HCT: 40.3 % (ref 35.0–47.0)
Lymphocyte #: 1.9 10*3/uL (ref 1.0–3.6)
Lymphocyte %: 28.5 %
MCH: 32.4 pg (ref 26.0–34.0)
MCHC: 34.2 g/dL (ref 32.0–36.0)
MCV: 95 fL (ref 80–100)
Monocyte #: 0.5 x10 3/mm (ref 0.2–0.9)
Monocyte %: 7.7 %
Neutrophil #: 4.2 10*3/uL (ref 1.4–6.5)
Platelet: 304 10*3/uL (ref 150–440)
RDW: 12.1 % (ref 11.5–14.5)
WBC: 6.8 10*3/uL (ref 3.6–11.0)

## 2013-07-14 ENCOUNTER — Other Ambulatory Visit: Payer: Self-pay | Admitting: Obstetrics & Gynecology

## 2013-07-16 ENCOUNTER — Telehealth: Payer: Self-pay

## 2013-07-16 NOTE — Telephone Encounter (Signed)
CALLED IN TWO MONTHS WORTH OF BIRTH CONTROL PATIENT OUT AND HAS NOT HAD A CHANCE TO COME IN FOR ANNUAL. HAS SCHEDULED FOR 09/01/12 @ 3:45. SENT IT TO [facility] ON [address]

## 2013-08-25 ENCOUNTER — Other Ambulatory Visit: Payer: Self-pay | Admitting: *Deleted

## 2013-08-25 DIAGNOSIS — IMO0001 Reserved for inherently not codable concepts without codable children: Secondary | ICD-10-CM

## 2013-08-25 MED ORDER — NORGESTIMATE-ETH ESTRADIOL 0.25-35 MG-MCG PO TABS: 1.0000 | ORAL_TABLET | Freq: Every day | ORAL | Status: DC

## 2013-08-25 NOTE — Telephone Encounter (Signed)
Patient has an appt on 2/23.  Pt called and needed a refill on her BCP.  I have sent in for 1 refill.

## 2013-09-01 ENCOUNTER — Encounter: Payer: Self-pay | Admitting: Obstetrics & Gynecology

## 2013-09-01 ENCOUNTER — Ambulatory Visit (INDEPENDENT_AMBULATORY_CARE_PROVIDER_SITE_OTHER): Payer: No Typology Code available for payment source | Admitting: Obstetrics & Gynecology

## 2013-09-01 VITALS — BP 123/72 | HR 85 | Ht 63.0 in | Wt 172.0 lb

## 2013-09-01 DIAGNOSIS — Z113 Encounter for screening for infections with a predominantly sexual mode of transmission: Secondary | ICD-10-CM

## 2013-09-01 DIAGNOSIS — IMO0001 Reserved for inherently not codable concepts without codable children: Secondary | ICD-10-CM

## 2013-09-01 DIAGNOSIS — Z309 Encounter for contraceptive management, unspecified: Secondary | ICD-10-CM

## 2013-09-01 DIAGNOSIS — Z01419 Encounter for gynecological examination (general) (routine) without abnormal findings: Secondary | ICD-10-CM

## 2013-09-01 DIAGNOSIS — Z124 Encounter for screening for malignant neoplasm of cervix: Secondary | ICD-10-CM

## 2013-09-01 DIAGNOSIS — R350 Frequency of micturition: Secondary | ICD-10-CM

## 2013-09-01 MED ORDER — NORGESTIMATE-ETH ESTRADIOL 0.25-35 MG-MCG PO TABS: 1.0000 | ORAL_TABLET | Freq: Every day | ORAL | Status: DC

## 2013-09-01 NOTE — Patient Instructions (Signed)
Human Papillomavirus Vaccine, Quadrivalent What is this medicine? HUMAN PAPILLOMAVIRUS VACCINE (HYOO muhn pap uh LOH muh vahy ruhs vak SEEN) is a vaccine. It is used to prevent infections of four types of the human papillomavirus. In women, the vaccine may lower your risk of getting cervical, vaginal, or anal cancer and genital warts. In men, the vaccine may lower your risk of getting genital warts and anal cancer. You cannot get these diseases from the vaccine. This vaccine does not treat these diseases. This medicine may be used for other purposes; ask your health care provider or pharmacist if you have questions. COMMON BRAND NAME(S): Gardasil What should I tell my health care provider before I take this medicine? They need to know if you have any of these conditions: -fever or infection -hemophilia -HIV infection or AIDS -immune system problems -low platelet count -an unusual reaction to Human Papillomavirus Vaccine, yeast, other medicines, foods, dyes, or preservatives -pregnant or trying to get pregnant -breast-feeding How should I use this medicine? This vaccine is for injection in a muscle on your upper arm or thigh. It is given by a health care professional. You will be observed for 15 minutes after each dose. Sometimes, fainting happens after the vaccine is given. You may be asked to sit or lie down during the 15 minutes. Three doses are given. The second dose is given 2 months after the first dose. The last dose is given 4 months after the second dose. A copy of a Vaccine Information Statement will be given before each vaccination. Read this sheet carefully each time. The sheet may change frequently. Talk to your pediatrician regarding the use of this medicine in children. While this drug may be prescribed for children as young as 9 years of age for selected conditions, precautions do apply. Overdosage: If you think you have taken too much of this medicine contact a poison control  center or emergency room at once. NOTE: This medicine is only for you. Do not share this medicine with others. What if I miss a dose? All 3 doses of the vaccine should be given within 6 months. Remember to keep appointments for follow-up doses. Your health care provider will tell you when to return for the next vaccine. Ask your health care professional for advice if you are unable to keep an appointment or miss a scheduled dose. What may interact with this medicine? -medicines that suppress your immune system like some medicines for cancer -steroid medicines like prednisone or cortisone -other vaccines This list may not describe all possible interactions. Give your health care provider a list of all the medicines, herbs, non-prescription drugs, or dietary supplements you use. Also tell them if you smoke, drink alcohol, or use illegal drugs. Some items may interact with your medicine. What should I watch for while using this medicine? This vaccine may not fully protect everyone. Continue to have regular pelvic exams and cervical or anal cancer screenings as directed by your doctor. The Human Papillomavirus is a sexually transmitted disease. It can be passed by any kind of sexual activity that involves genital contact. The vaccine works best when given before you have any contact with the virus. Many people who have the virus do not have any signs or symptoms. Tell your doctor or health care professional if you have any reaction or unusual symptom after getting the vaccine. What side effects may I notice from receiving this medicine? Side effects that you should report to your doctor or health care professional   as soon as possible: -allergic reactions like skin rash, itching or hives, swelling of the face, lips, or tongue -breathing problems -feeling faint or lightheaded, falls Side effects that usually do not require medical attention (report to your doctor or health care professional if they  continue or are bothersome): -cough -fever -redness, warmth, swelling, pain, or itching at site where injected This list may not describe all possible side effects. Call your doctor for medical advice about side effects. You may report side effects to FDA at 1-800-FDA-1088. Where should I keep my medicine? This drug is given in a hospital or clinic and will not be stored at home. NOTE: This sheet is a summary. It may not cover all possible information. If you have questions about this medicine, talk to your doctor, pharmacist, or health care provider.  2014, [employer]vier/[employer] Standard. (2009-07-01 11:52:23)

## 2013-09-01 NOTE — Addendum Note (Signed)
Addended by: Erik Obey on: 09/01/2013 04:34 PM   Modules accepted: Orders

## 2013-09-01 NOTE — Progress Notes (Signed)
Subjective:    Kerry [name] is a 28 y.o. female who presents for an annual exam. The patient has no complaints today. She wants a refill of OCPs. The patient is sexually active. GYN screening history: last pap: was normal. The patient wears seatbelts: yes. The patient participates in regular exercise: yes. Has the patient ever been transfused or tattooed?: no. The patient reports that there is not domestic violence in her life.   Menstrual History: OB History   Grav Para Term Preterm Abortions TAB SAB Ect Mult Living   1 1  1      1       Menarche age: 25  Patient's last menstrual period was 08/19/2013.    The following portions of the patient's history were reviewed and updated as appropriate: allergies, current medications, past family history, past medical history, past social history, past surgical history and problem list.  Review of Systems A comprehensive review of systems was negative. Married for 3 years, monogamous for 7 years. In nursing school. She declines to finish Gardasil, already had a flu vaccine   Objective:    BP 123/72  Pulse 85  Ht 5\' 3"  (1.6 m)  Wt 172 lb (78.019 kg)  BMI 30.48 kg/m2  LMP 08/19/2013  General Appearance:    Alert, cooperative, no distress, appears stated age  Head:    Normocephalic, without obvious abnormality, atraumatic  Eyes:    PERRL, conjunctiva/corneas clear, EOM's intact, fundi    benign, both eyes  Ears:    Normal TM's and external ear canals, both ears  Nose:   Nares normal, septum midline, mucosa normal, no drainage    or sinus tenderness  Throat:   Lips, mucosa, and tongue normal; teeth and gums normal  Neck:   Supple, symmetrical, trachea midline, no adenopathy;    thyroid:  no enlargement/tenderness/nodules; no carotid   bruit or JVD  Back:     Symmetric, no curvature, ROM normal, no CVA tenderness  Lungs:     Clear to auscultation bilaterally, respirations unlabored  Chest Wall:    No tenderness or deformity   Heart:     Regular rate and rhythm, S1 and S2 normal, no murmur, rub   or gallop  Breast Exam:    No tenderness, masses, or nipple abnormality  Abdomen:     Soft, non-tender, bowel sounds active all four quadrants,    no masses, no organomegaly  Genitalia:    Normal female without lesion, discharge or tenderness, NSSR, mobile, NT, normal adnexal exam     Extremities:   Extremities normal, atraumatic, no cyanosis or edema  Pulses:   2+ and symmetric all extremities  Skin:   Skin color, texture, turgor normal, no rashes or lesions  Lymph nodes:   Cervical, supraclavicular, and axillary nodes normal  Neurologic:   CNII-XII intact, normal strength, sensation and reflexes    throughout  .    Assessment:    Healthy female exam.    Plan:     Breast self exam technique reviewed and patient encouraged to perform self-exam monthly. Chlamydia specimen. GC specimen. Thin prep Pap smear.

## 2014-01-22 ENCOUNTER — Telehealth: Payer: Self-pay | Admitting: *Deleted

## 2014-01-22 NOTE — Telephone Encounter (Signed)
Patient called and left message on v.m. That she feels like her anxiety is worsening and she wanted to know if she needs to get a referral or if she should make an appointment to come back to see [place].

## 2014-01-22 NOTE — Telephone Encounter (Signed)
I cannot see that we have treated her for this recently.  Please refer her.

## 2014-03-09 ENCOUNTER — Other Ambulatory Visit: Payer: Self-pay | Admitting: Obstetrics & Gynecology

## 2014-03-27 ENCOUNTER — Telehealth: Payer: Self-pay | Admitting: *Deleted

## 2014-03-27 MED ORDER — FLUCONAZOLE 150 MG PO TABS: ORAL_TABLET | ORAL | Status: DC

## 2014-03-27 NOTE — Telephone Encounter (Signed)
Patient is having a yeast infection and would like medication called in for her.

## 2014-05-11 ENCOUNTER — Encounter: Payer: Self-pay | Admitting: Obstetrics & Gynecology

## 2014-09-02 ENCOUNTER — Encounter: Payer: Self-pay | Admitting: Family Medicine

## 2014-09-02 ENCOUNTER — Ambulatory Visit (INDEPENDENT_AMBULATORY_CARE_PROVIDER_SITE_OTHER): Payer: No Typology Code available for payment source | Admitting: Family Medicine

## 2014-09-02 VITALS — BP 122/74 | HR 84 | Ht 63.0 in | Wt 182.2 lb

## 2014-09-02 DIAGNOSIS — Z3040 Encounter for surveillance of contraceptives, unspecified: Secondary | ICD-10-CM

## 2014-09-02 DIAGNOSIS — F419 Anxiety disorder, unspecified: Secondary | ICD-10-CM

## 2014-09-02 DIAGNOSIS — Z3041 Encounter for surveillance of contraceptive pills: Secondary | ICD-10-CM

## 2014-09-02 DIAGNOSIS — Z01419 Encounter for gynecological examination (general) (routine) without abnormal findings: Secondary | ICD-10-CM

## 2014-09-02 DIAGNOSIS — Z124 Encounter for screening for malignant neoplasm of cervix: Secondary | ICD-10-CM

## 2014-09-02 DIAGNOSIS — Z3009 Encounter for other general counseling and advice on contraception: Secondary | ICD-10-CM

## 2014-09-02 MED ORDER — BUPROPION HCL ER (SR) 150 MG PO TB12: 150.0000 mg | ORAL_TABLET | Freq: Two times a day (BID) | ORAL | Status: DC

## 2014-09-02 MED ORDER — NORGESTIMATE-ETH ESTRADIOL 0.25-35 MG-MCG PO TABS: 1.0000 | ORAL_TABLET | Freq: Every day | ORAL | Status: DC

## 2014-09-02 NOTE — Progress Notes (Signed)
Patient wants to discuss something different to take other than Celexa, it helped her anxiety but she had bad side effects of grinding her teeth and fatigue and yawning.

## 2014-09-02 NOTE — Patient Instructions (Signed)
Contraception Choices Contraception (birth control) is the use of any methods or devices to prevent pregnancy. Below are some methods to help avoid pregnancy. HORMONAL METHODS   Contraceptive implant. This is a thin, plastic tube containing progesterone hormone. It does not contain estrogen hormone. Your health care provider inserts the tube in the inner part of the upper arm. The tube can remain in place for up to 3 years. After 3 years, the implant must be removed. The implant prevents the ovaries from releasing an egg (ovulation), thickens the cervical mucus to prevent sperm from entering the uterus, and thins the lining of the inside of the uterus.  Progesterone-only injections. These injections are given every 3 months by your health care provider to prevent pregnancy. This synthetic progesterone hormone stops the ovaries from releasing eggs. It also thickens cervical mucus and changes the uterine lining. This makes it harder for sperm to survive in the uterus.  Birth control pills. These pills contain estrogen and progesterone hormone. They work by preventing the ovaries from releasing eggs (ovulation). They also cause the cervical mucus to thicken, preventing the sperm from entering the uterus. Birth control pills are prescribed by a health care provider.Birth control pills can also be used to treat heavy periods.  Minipill. This type of birth control pill contains only the progesterone hormone. They are taken every day of each month and must be prescribed by your health care provider.  Birth control patch. The patch contains hormones similar to those in birth control pills. It must be changed once a week and is prescribed by a health care provider.  Vaginal ring. The ring contains hormones similar to those in birth control pills. It is left in the vagina for 3 weeks, removed for 1 week, and then a new one is put back in place. The patient must be comfortable inserting and removing the ring  from the vagina.A health care provider's prescription is necessary.  Emergency contraception. Emergency contraceptives prevent pregnancy after unprotected sexual intercourse. This pill can be taken right after sex or up to 5 days after unprotected sex. It is most effective the sooner you take the pills after having sexual intercourse. Most emergency contraceptive pills are available without a prescription. Check with your pharmacist. Do not use emergency contraception as your only form of birth control. BARRIER METHODS   Female condom. This is a thin sheath (latex or rubber) that is worn over the penis during sexual intercourse. It can be used with spermicide to increase effectiveness.  Female condom. This is a soft, loose-fitting sheath that is put into the vagina before sexual intercourse.  Diaphragm. This is a soft, latex, dome-shaped barrier that must be fitted by a health care provider. It is inserted into the vagina, along with a spermicidal jelly. It is inserted before intercourse. The diaphragm should be left in the vagina for 6 to 8 hours after intercourse.  Cervical cap. This is a round, soft, latex or plastic cup that fits over the cervix and must be fitted by a health care provider. The cap can be left in place for up to 48 hours after intercourse.  Sponge. This is a soft, circular piece of polyurethane foam. The sponge has spermicide in it. It is inserted into the vagina after wetting it and before sexual intercourse.  Spermicides. These are chemicals that kill or block sperm from entering the cervix and uterus. They come in the form of creams, jellies, suppositories, foam, or tablets. They do not require a  prescription. They are inserted into the vagina with an applicator before having sexual intercourse. The process must be repeated every time you have sexual intercourse. INTRAUTERINE CONTRACEPTION  Intrauterine device (IUD). This is a T-shaped device that is put in a woman's uterus  during a menstrual period to prevent pregnancy. There are 2 types:  Copper IUD. This type of IUD is wrapped in copper wire and is placed inside the uterus. Copper makes the uterus and fallopian tubes produce a fluid that kills sperm. It can stay in place for 10 years.  Hormone IUD. This type of IUD contains the hormone progestin (synthetic progesterone). The hormone thickens the cervical mucus and prevents sperm from entering the uterus, and it also thins the uterine lining to prevent implantation of a fertilized egg. The hormone can weaken or kill the sperm that get into the uterus. It can stay in place for 3-5 years, depending on which type of IUD is used. PERMANENT METHODS OF CONTRACEPTION  Female tubal ligation. This is when the woman's fallopian tubes are surgically sealed, tied, or blocked to prevent the egg from traveling to the uterus.  Hysteroscopic sterilization. This involves placing a small coil or insert into each fallopian tube. Your doctor uses a technique called hysteroscopy to do the procedure. The device causes scar tissue to form. This results in permanent blockage of the fallopian tubes, so the sperm cannot fertilize the egg. It takes about 3 months after the procedure for the tubes to become blocked. You must use another form of birth control for these 3 months.  Female sterilization. This is when the female has the tubes that carry sperm tied off (vasectomy).This blocks sperm from entering the vagina during sexual intercourse. After the procedure, the man can still ejaculate fluid (semen). NATURAL PLANNING METHODS  Natural family planning. This is not having sexual intercourse or using a barrier method (condom, diaphragm, cervical cap) on days the woman could become pregnant.  Calendar method. This is keeping track of the length of each menstrual cycle and identifying when you are fertile.  Ovulation method. This is avoiding sexual intercourse during ovulation.  Symptothermal  method. This is avoiding sexual intercourse during ovulation, using a thermometer and ovulation symptoms.  Post-ovulation method. This is timing sexual intercourse after you have ovulated. Regardless of which type or method of contraception you choose, it is important that you use condoms to protect against the transmission of sexually transmitted infections (STIs). Talk with your health care provider about which form of contraception is most appropriate for you. Document Released: 06/26/2005 Document Revised: 07/01/2013 Document Reviewed: 12/19/2012 [facility] Patient Information 2015 [place], [place]. This information is not intended to replace advice given to you by your health care provider. Make sure you discuss any questions you have with your health care provider. Preventive Care for Adults A healthy lifestyle and preventive care can promote health and wellness. Preventive health guidelines for women include the following key practices.  A routine yearly physical is a good way to check with your health care provider about your health and preventive screening. It is a chance to share any concerns and updates on your health and to receive a thorough exam.  Visit your dentist for a routine exam and preventive care every 6 months. Brush your teeth twice a day and floss once a day. Good oral hygiene prevents tooth decay and gum disease.  The frequency of eye exams is based on your age, health, family medical history, use of contact lenses, and other  factors. Follow your health care provider's recommendations for frequency of eye exams.  Eat a healthy diet. Foods like vegetables, fruits, whole grains, low-fat dairy products, and lean protein foods contain the nutrients you need without too many calories. Decrease your intake of foods high in solid fats, added sugars, and salt. Eat the right amount of calories for you.Get information about a proper diet from your health care provider, if  necessary.  Regular physical exercise is one of the most important things you can do for your health. Most adults should get at least 150 minutes of moderate-intensity exercise (any activity that increases your heart rate and causes you to sweat) each week. In addition, most adults need muscle-strengthening exercises on 2 or more days a week.  Maintain a healthy weight. The body mass index (BMI) is a screening tool to identify possible weight problems. It provides an estimate of body fat based on height and weight. Your health care provider can find your BMI and can help you achieve or maintain a healthy weight.For adults 20 years and older:  A BMI below 18.5 is considered underweight.  A BMI of 18.5 to 24.9 is normal.  A BMI of 25 to 29.9 is considered overweight.  A BMI of 30 and above is considered obese.  Maintain normal blood lipids and cholesterol levels by exercising and minimizing your intake of saturated fat. Eat a balanced diet with plenty of fruit and vegetables. Blood tests for lipids and cholesterol should begin at age 4 and be repeated every 5 years. If your lipid or cholesterol levels are high, you are over 50, or you are at high risk for heart disease, you may need your cholesterol levels checked more frequently.Ongoing high lipid and cholesterol levels should be treated with medicines if diet and exercise are not working.  If you smoke, find out from your health care provider how to quit. If you do not use tobacco, do not start.  Lung cancer screening is recommended for adults aged 30-80 years who are at high risk for developing lung cancer because of a history of smoking. A yearly low-dose CT scan of the lungs is recommended for people who have at least a 30-pack-year history of smoking and are a current smoker or have quit within the past 15 years. A pack year of smoking is smoking an average of 1 pack of cigarettes a day for 1 year (for example: 1 pack a day for 30 years or 2  packs a day for 15 years). Yearly screening should continue until the smoker has stopped smoking for at least 15 years. Yearly screening should be stopped for people who develop a health problem that would prevent them from having lung cancer treatment.  If you are pregnant, do not drink alcohol. If you are breastfeeding, be very cautious about drinking alcohol. If you are not pregnant and choose to drink alcohol, do not have more than 1 drink per day. One drink is considered to be 12 ounces (355 mL) of beer, 5 ounces (148 mL) of wine, or 1.5 ounces (44 mL) of liquor.  Avoid use of street drugs. Do not share needles with anyone. Ask for help if you need support or instructions about stopping the use of drugs.  High blood pressure causes heart disease and increases the risk of stroke. Your blood pressure should be checked at least every 1 to 2 years. Ongoing high blood pressure should be treated with medicines if weight loss and exercise do not work.  If you are 71-59 years old, ask your health care provider if you should take aspirin to prevent strokes.  Diabetes screening involves taking a blood sample to check your fasting blood sugar level. This should be done once every 3 years, after age 30, if you are within normal weight and without risk factors for diabetes. Testing should be considered at a younger age or be carried out more frequently if you are overweight and have at least 1 risk factor for diabetes.  Breast cancer screening is essential preventive care for women. You should practice "breast self-awareness." This means understanding the normal appearance and feel of your breasts and may include breast self-examination. Any changes detected, no matter how small, should be reported to a health care provider. Women in their 90s and 30s should have a clinical breast exam (CBE) by a health care provider as part of a regular health exam every 1 to 3 years. After age 35, women should have a CBE every  year. Starting at age 36, women should consider having a mammogram (breast X-ray test) every year. Women who have a family history of breast cancer should talk to their health care provider about genetic screening. Women at a high risk of breast cancer should talk to their health care providers about having an MRI and a mammogram every year.  Breast cancer gene (BRCA)-related cancer risk assessment is recommended for women who have family members with BRCA-related cancers. BRCA-related cancers include breast, ovarian, tubal, and peritoneal cancers. Having family members with these cancers may be associated with an increased risk for harmful changes (mutations) in the breast cancer genes BRCA1 and BRCA2. Results of the assessment will determine the need for genetic counseling and BRCA1 and BRCA2 testing.  Routine pelvic exams to screen for cancer are no longer recommended for nonpregnant women who are considered low risk for cancer of the pelvic organs (ovaries, uterus, and vagina) and who do not have symptoms. Ask your health care provider if a screening pelvic exam is right for you.  If you have had past treatment for cervical cancer or a condition that could lead to cancer, you need Pap tests and screening for cancer for at least 20 years after your treatment. If Pap tests have been discontinued, your risk factors (such as having a new sexual partner) need to be reassessed to determine if screening should be resumed. Some women have medical problems that increase the chance of getting cervical cancer. In these cases, your health care provider may recommend more frequent screening and Pap tests.  The HPV test is an additional test that may be used for cervical cancer screening. The HPV test looks for the virus that can cause the cell changes on the cervix. The cells collected during the Pap test can be tested for HPV. The HPV test could be used to screen women aged 57 years and older, and should be used in  women of any age who have unclear Pap test results. After the age of 65, women should have HPV testing at the same frequency as a Pap test.  Colorectal cancer can be detected and often prevented. Most routine colorectal cancer screening begins at the age of 71 years and continues through age 73 years. However, your health care provider may recommend screening at an earlier age if you have risk factors for colon cancer. On a yearly basis, your health care provider may provide home test kits to check for hidden blood in the stool. Use of a  small camera at the end of a tube, to directly examine the colon (sigmoidoscopy or colonoscopy), can detect the earliest forms of colorectal cancer. Talk to your health care provider about this at age 54, when routine screening begins. Direct exam of the colon should be repeated every 5-10 years through age 11 years, unless early forms of pre-cancerous polyps or small growths are found.  People who are at an increased risk for hepatitis B should be screened for this virus. You are considered at high risk for hepatitis B if:  You were born in a country where hepatitis B occurs often. Talk with your health care provider about which countries are considered high risk.  Your parents were born in a high-risk country and you have not received a shot to protect against hepatitis B (hepatitis B vaccine).  You have HIV or AIDS.  You use needles to inject street drugs.  You live with, or have sex with, someone who has hepatitis B.  You get hemodialysis treatment.  You take certain medicines for conditions like cancer, organ transplantation, and autoimmune conditions.  Hepatitis C blood testing is recommended for all people born from [birth year] through 1963 and any individual with known risks for hepatitis C.  Practice safe sex. Use condoms and avoid high-risk sexual practices to reduce the spread of sexually transmitted infections (STIs). STIs include gonorrhea, chlamydia,  syphilis, trichomonas, herpes, HPV, and human immunodeficiency virus (HIV). Herpes, HIV, and HPV are viral illnesses that have no cure. They can result in disability, cancer, and death.  You should be screened for sexually transmitted illnesses (STIs) including gonorrhea and chlamydia if:  You are sexually active and are younger than 24 years.  You are older than 24 years and your health care provider tells you that you are at risk for this type of infection.  Your sexual activity has changed since you were last screened and you are at an increased risk for chlamydia or gonorrhea. Ask your health care provider if you are at risk.  If you are at risk of being infected with HIV, it is recommended that you take a prescription medicine daily to prevent HIV infection. This is called preexposure prophylaxis (PrEP). You are considered at risk if:  You are a heterosexual woman, are sexually active, and are at increased risk for HIV infection.  You take drugs by injection.  You are sexually active with a partner who has HIV.  Talk with your health care provider about whether you are at high risk of being infected with HIV. If you choose to begin PrEP, you should first be tested for HIV. You should then be tested every 3 months for as long as you are taking PrEP.  Osteoporosis is a disease in which the bones lose minerals and strength with aging. This can result in serious bone fractures or breaks. The risk of osteoporosis can be identified using a bone density scan. Women ages 73 years and over and women at risk for fractures or osteoporosis should discuss screening with their health care providers. Ask your health care provider whether you should take a calcium supplement or vitamin D to reduce the rate of osteoporosis.  Menopause can be associated with physical symptoms and risks. Hormone replacement therapy is available to decrease symptoms and risks. You should talk to your health care provider  about whether hormone replacement therapy is right for you.  Use sunscreen. Apply sunscreen liberally and repeatedly throughout the day. You should seek shade when your shadow  is shorter than you. Protect yourself by wearing long sleeves, pants, a wide-brimmed hat, and sunglasses year round, whenever you are outdoors.  Once a month, do a whole body skin exam, using a mirror to look at the skin on your back. Tell your health care provider of new moles, moles that have irregular borders, moles that are larger than a pencil eraser, or moles that have changed in shape or color.  Stay current with required vaccines (immunizations).  Influenza vaccine. All adults should be immunized every year.  Tetanus, diphtheria, and acellular pertussis (Td, Tdap) vaccine. Pregnant women should receive 1 dose of Tdap vaccine during each pregnancy. The dose should be obtained regardless of the length of time since the last dose. Immunization is preferred during the 27th-36th week of gestation. An adult who has not previously received Tdap or who does not know her vaccine status should receive 1 dose of Tdap. This initial dose should be followed by tetanus and diphtheria toxoids (Td) booster doses every 10 years. Adults with an unknown or incomplete history of completing a 3-dose immunization series with Td-containing vaccines should begin or complete a primary immunization series including a Tdap dose. Adults should receive a Td booster every 10 years.  Varicella vaccine. An adult without evidence of immunity to varicella should receive 2 doses or a second dose if she has previously received 1 dose. Pregnant females who do not have evidence of immunity should receive the first dose after pregnancy. This first dose should be obtained before leaving the health care facility. The second dose should be obtained 4-8 weeks after the first dose.  Human papillomavirus (HPV) vaccine. Females aged 13-26 years who have not received  the vaccine previously should obtain the 3-dose series. The vaccine is not recommended for use in pregnant females. However, pregnancy testing is not needed before receiving a dose. If a female is found to be pregnant after receiving a dose, no treatment is needed. In that case, the remaining doses should be delayed until after the pregnancy. Immunization is recommended for any person with an immunocompromised condition through the age of 64 years if she did not get any or all doses earlier. During the 3-dose series, the second dose should be obtained 4-8 weeks after the first dose. The third dose should be obtained 24 weeks after the first dose and 16 weeks after the second dose.  Zoster vaccine. One dose is recommended for adults aged 67 years or older unless certain conditions are present.  Measles, mumps, and rubella (MMR) vaccine. Adults born before [birth year] generally are considered immune to measles and mumps. Adults born in [birth year] or later should have 1 or more doses of MMR vaccine unless there is a contraindication to the vaccine or there is laboratory evidence of immunity to each of the three diseases. A routine second dose of MMR vaccine should be obtained at least 28 days after the first dose for students attending postsecondary schools, health care workers, or international travelers. People who received inactivated measles vaccine or an unknown type of measles vaccine during 1963-1967 should receive 2 doses of MMR vaccine. People who received inactivated mumps vaccine or an unknown type of mumps vaccine before 1979 and are at high risk for mumps infection should consider immunization with 2 doses of MMR vaccine. For females of childbearing age, rubella immunity should be determined. If there is no evidence of immunity, females who are not pregnant should be vaccinated. If there is no evidence of immunity, females who  are pregnant should delay immunization until after pregnancy. Unvaccinated health care  workers born before [birth year] who lack laboratory evidence of measles, mumps, or rubella immunity or laboratory confirmation of disease should consider measles and mumps immunization with 2 doses of MMR vaccine or rubella immunization with 1 dose of MMR vaccine.  Pneumococcal 13-valent conjugate (PCV13) vaccine. When indicated, a person who is uncertain of her immunization history and has no record of immunization should receive the PCV13 vaccine. An adult aged 8 years or older who has certain medical conditions and has not been previously immunized should receive 1 dose of PCV13 vaccine. This PCV13 should be followed with a dose of pneumococcal polysaccharide (PPSV23) vaccine. The PPSV23 vaccine dose should be obtained at least 8 weeks after the dose of PCV13 vaccine. An adult aged 43 years or older who has certain medical conditions and previously received 1 or more doses of PPSV23 vaccine should receive 1 dose of PCV13. The PCV13 vaccine dose should be obtained 1 or more years after the last PPSV23 vaccine dose.  Pneumococcal polysaccharide (PPSV23) vaccine. When PCV13 is also indicated, PCV13 should be obtained first. All adults aged 74 years and older should be immunized. An adult younger than age 23 years who has certain medical conditions should be immunized. Any person who resides in a nursing home or long-term care facility should be immunized. An adult smoker should be immunized. People with an immunocompromised condition and certain other conditions should receive both PCV13 and PPSV23 vaccines. People with human immunodeficiency virus (HIV) infection should be immunized as soon as possible after diagnosis. Immunization during chemotherapy or radiation therapy should be avoided. Routine use of PPSV23 vaccine is not recommended for American Indians, [place]atives, or people younger than 65 years unless there are medical conditions that require PPSV23 vaccine. When indicated, people who have unknown  immunization and have no record of immunization should receive PPSV23 vaccine. One-time revaccination 5 years after the first dose of PPSV23 is recommended for people aged 19-64 years who have chronic kidney failure, nephrotic syndrome, asplenia, or immunocompromised conditions. People who received 1-2 doses of PPSV23 before age 26 years should receive another dose of PPSV23 vaccine at age 23 years or later if at least 5 years have passed since the previous dose. Doses of PPSV23 are not needed for people immunized with PPSV23 at or after age 82 years.  Meningococcal vaccine. Adults with asplenia or persistent complement component deficiencies should receive 2 doses of quadrivalent meningococcal conjugate (MenACWY-D) vaccine. The doses should be obtained at least 2 months apart. Microbiologists working with certain meningococcal bacteria, [employer] recruits, people at risk during an outbreak, and people who travel to or live in countries with a high rate of meningitis should be immunized. A first-year college student up through age 104 years who is living in a residence hall should receive a dose if she did not receive a dose on or after her 16th birthday. Adults who have certain high-risk conditions should receive one or more doses of vaccine.  Hepatitis A vaccine. Adults who wish to be protected from this disease, have certain high-risk conditions, work with hepatitis A-infected animals, work in hepatitis A research labs, or travel to or work in countries with a high rate of hepatitis A should be immunized. Adults who were previously unvaccinated and who anticipate close contact with an international adoptee during the first 60 days after arrival in the [place] from a country with a high rate of hepatitis A should be  immunized.  Hepatitis B vaccine. Adults who wish to be protected from this disease, have certain high-risk conditions, may be exposed to blood or other infectious body fluids, are household  contacts or sex partners of hepatitis B positive people, are clients or workers in certain care facilities, or travel to or work in countries with a high rate of hepatitis B should be immunized.  Haemophilus influenzae type b (Hib) vaccine. A previously unvaccinated person with asplenia or sickle cell disease or having a scheduled splenectomy should receive 1 dose of Hib vaccine. Regardless of previous immunization, a recipient of a hematopoietic stem cell transplant should receive a 3-dose series 6-12 months after her successful transplant. Hib vaccine is not recommended for adults with HIV infection. Preventive Services / Frequency Ages 69 to 16 years  Blood pressure check.** / Every 1 to 2 years.  Lipid and cholesterol check.** / Every 5 years beginning at age 72.  Clinical breast exam.** / Every 3 years for women in their 19s and 70s.  BRCA-related cancer risk assessment.** / For women who have family members with a BRCA-related cancer (breast, ovarian, tubal, or peritoneal cancers).  Pap test.** / Every 2 years from ages 34 through 28. Every 3 years starting at age 72 through age 76 or 42 with a history of 3 consecutive normal Pap tests.  HPV screening.** / Every 3 years from ages 16 through ages 69 to 25 with a history of 3 consecutive normal Pap tests.  Hepatitis C blood test.** / For any individual with known risks for hepatitis C.  Skin self-exam. / Monthly.  Influenza vaccine. / Every year.  Tetanus, diphtheria, and acellular pertussis (Tdap, Td) vaccine.** / Consult your health care provider. Pregnant women should receive 1 dose of Tdap vaccine during each pregnancy. 1 dose of Td every 10 years.  Varicella vaccine.** / Consult your health care provider. Pregnant females who do not have evidence of immunity should receive the first dose after pregnancy.  HPV vaccine. / 3 doses over 6 months, if 76 and younger. The vaccine is not recommended for use in pregnant females. However,  pregnancy testing is not needed before receiving a dose.  Measles, mumps, rubella (MMR) vaccine.** / You need at least 1 dose of MMR if you were born in 1957 or later. You may also need a 2nd dose. For females of childbearing age, rubella immunity should be determined. If there is no evidence of immunity, females who are not pregnant should be vaccinated. If there is no evidence of immunity, females who are pregnant should delay immunization until after pregnancy.  Pneumococcal 13-valent conjugate (PCV13) vaccine.** / Consult your health care provider.  Pneumococcal polysaccharide (PPSV23) vaccine.** / 1 to 2 doses if you smoke cigarettes or if you have certain conditions.  Meningococcal vaccine.** / 1 dose if you are age 46 to 96 years and a Market researcher living in a residence hall, or have one of several medical conditions, you need to get vaccinated against meningococcal disease. You may also need additional booster doses.  Hepatitis A vaccine.** / Consult your health care provider.  Hepatitis B vaccine.** / Consult your health care provider.  Haemophilus influenzae type b (Hib) vaccine.** / Consult your health care provider. Ages 43 to 58 years  Blood pressure check.** / Every 1 to 2 years.  Lipid and cholesterol check.** / Every 5 years beginning at age 1 years.  Lung cancer screening. / Every year if you are aged 69-80 years and have a  30-pack-year history of smoking and currently smoke or have quit within the past 15 years. Yearly screening is stopped once you have quit smoking for at least 15 years or develop a health problem that would prevent you from having lung cancer treatment.  Clinical breast exam.** / Every year after age 21 years.  BRCA-related cancer risk assessment.** / For women who have family members with a BRCA-related cancer (breast, ovarian, tubal, or peritoneal cancers).  Mammogram.** / Every year beginning at age 72 years and continuing for as long  as you are in good health. Consult with your health care provider.  Pap test.** / Every 3 years starting at age 24 years through age 78 or 68 years with a history of 3 consecutive normal Pap tests.  HPV screening.** / Every 3 years from ages 66 years through ages 92 to 92 years with a history of 3 consecutive normal Pap tests.  Fecal occult blood test (FOBT) of stool. / Every year beginning at age 5 years and continuing until age 56 years. You may not need to do this test if you get a colonoscopy every 10 years.  Flexible sigmoidoscopy or colonoscopy.** / Every 5 years for a flexible sigmoidoscopy or every 10 years for a colonoscopy beginning at age 61 years and continuing until age 75 years.  Hepatitis C blood test.** / For all people born from [birth year] through 1965 and any individual with known risks for hepatitis C.  Skin self-exam. / Monthly.  Influenza vaccine. / Every year.  Tetanus, diphtheria, and acellular pertussis (Tdap/Td) vaccine.** / Consult your health care provider. Pregnant women should receive 1 dose of Tdap vaccine during each pregnancy. 1 dose of Td every 10 years.  Varicella vaccine.** / Consult your health care provider. Pregnant females who do not have evidence of immunity should receive the first dose after pregnancy.  Zoster vaccine.** / 1 dose for adults aged 63 years or older.  Measles, mumps, rubella (MMR) vaccine.** / You need at least 1 dose of MMR if you were born in 1957 or later. You may also need a 2nd dose. For females of childbearing age, rubella immunity should be determined. If there is no evidence of immunity, females who are not pregnant should be vaccinated. If there is no evidence of immunity, females who are pregnant should delay immunization until after pregnancy.  Pneumococcal 13-valent conjugate (PCV13) vaccine.** / Consult your health care provider.  Pneumococcal polysaccharide (PPSV23) vaccine.** / 1 to 2 doses if you smoke cigarettes or if you  have certain conditions.  Meningococcal vaccine.** / Consult your health care provider.  Hepatitis A vaccine.** / Consult your health care provider.  Hepatitis B vaccine.** / Consult your health care provider.  Haemophilus influenzae type b (Hib) vaccine.** / Consult your health care provider. Ages 64 years and over  Blood pressure check.** / Every 1 to 2 years.  Lipid and cholesterol check.** / Every 5 years beginning at age 62 years.  Lung cancer screening. / Every year if you are aged 59-80 years and have a 30-pack-year history of smoking and currently smoke or have quit within the past 15 years. Yearly screening is stopped once you have quit smoking for at least 15 years or develop a health problem that would prevent you from having lung cancer treatment.  Clinical breast exam.** / Every year after age 36 years.  BRCA-related cancer risk assessment.** / For women who have family members with a BRCA-related cancer (breast, ovarian, tubal, or peritoneal cancers).  Mammogram.** / Every year beginning at age 21 years and continuing for as long as you are in good health. Consult with your health care provider.  Pap test.** / Every 3 years starting at age 2 years through age 60 or 90 years with 3 consecutive normal Pap tests. Testing can be stopped between 65 and 70 years with 3 consecutive normal Pap tests and no abnormal Pap or HPV tests in the past 10 years.  HPV screening.** / Every 3 years from ages 60 years through ages 17 or 49 years with a history of 3 consecutive normal Pap tests. Testing can be stopped between 65 and 70 years with 3 consecutive normal Pap tests and no abnormal Pap or HPV tests in the past 10 years.  Fecal occult blood test (FOBT) of stool. / Every year beginning at age 16 years and continuing until age 75 years. You may not need to do this test if you get a colonoscopy every 10 years.  Flexible sigmoidoscopy or colonoscopy.** / Every 5 years for a flexible  sigmoidoscopy or every 10 years for a colonoscopy beginning at age 78 years and continuing until age 55 years.  Hepatitis C blood test.** / For all people born from [birth year] through 1965 and any individual with known risks for hepatitis C.  Osteoporosis screening.** / A one-time screening for women ages 60 years and over and women at risk for fractures or osteoporosis.  Skin self-exam. / Monthly.  Influenza vaccine. / Every year.  Tetanus, diphtheria, and acellular pertussis (Tdap/Td) vaccine.** / 1 dose of Td every 10 years.  Varicella vaccine.** / Consult your health care provider.  Zoster vaccine.** / 1 dose for adults aged 32 years or older.  Pneumococcal 13-valent conjugate (PCV13) vaccine.** / Consult your health care provider.  Pneumococcal polysaccharide (PPSV23) vaccine.** / 1 dose for all adults aged 48 years and older.  Meningococcal vaccine.** / Consult your health care provider.  Hepatitis A vaccine.** / Consult your health care provider.  Hepatitis B vaccine.** / Consult your health care provider.  Haemophilus influenzae type b (Hib) vaccine.** / Consult your health care provider. ** Family history and personal history of risk and conditions may change your health care provider's recommendations. Document Released: 08/22/2001 Document Revised: 11/10/2013 Document Reviewed: 11/21/2010 [facility] Patient Information 2015 [place], [place]. This information is not intended to replace advice given to you by your health care provider. Make sure you discuss any questions you have with your health care provider.

## 2014-09-02 NOTE — Progress Notes (Signed)
  Subjective:     Kerry [name] is a 29 y.o. female and is here for a comprehensive physical exam. The patient reports no problems. Works at [employer].  On OC's tolerates well--considering change to [employer]. Previously on Celexa, but too sleepy on it.  Still needs medication for anxiety, depression and anger  History   Social History  . Marital Status: Married    Spouse Name: N/A  . Number of Children: N/A  . Years of Education: N/A   Occupational History  . Not on file.   Social History Main Topics  . Smoking status: Never Smoker   . Smokeless tobacco: Never Used  . Alcohol Use: Yes     Comment: rare  . Drug Use: No  . Sexual Activity:    Partners: Male    Birth Control/ Protection: OCP, Pill   Other Topics Concern  . Not on file   Social History Narrative   Health Maintenance  Topic Date Due  . HIV Screening  07/27/2002  . TETANUS/TDAP  07/27/2006  . INFLUENZA VACCINE  10/08/2014 (Originally 02/07/2014)  . PAP SMEAR  09/01/2016    The following portions of the patient's history were reviewed and updated as appropriate: allergies, current medications, past family history, past medical history, past social history, past surgical history and problem list.  Review of Systems A comprehensive review of systems was negative.   Objective:    BP 122/74 mmHg  Pulse 84  Ht 5\' 3"  (1.6 m)  Wt 182 lb 3.2 oz (82.645 kg)  BMI 32.28 kg/m2  LMP 08/23/2014 (Exact Date) General appearance: alert, cooperative and appears stated age Head: Normocephalic, without obvious abnormality, atraumatic Neck: no adenopathy, supple, symmetrical, trachea midline and thyroid not enlarged, symmetric, no tenderness/mass/nodules Lungs: clear to auscultation bilaterally Breasts: normal appearance, no masses or tenderness, bilateral fibrocystic changes. Heart: regular rate and rhythm, S1, S2 normal, no murmur, click, rub or gallop Abdomen: soft, non-tender; bowel sounds normal; no masses,   no organomegaly Pelvic: cervix normal in appearance, external genitalia normal, no adnexal masses or tenderness, no cervical motion tenderness, uterus normal size, shape, and consistency and vagina normal without discharge Extremities: extremities normal, atraumatic, no cyanosis or edema Pulses: 2+ and symmetric Skin: Skin color, texture, turgor normal. No rashes or lesions Lymph nodes: Cervical, supraclavicular, and axillary nodes normal. Neurologic: Grossly normal    Assessment:    Healthy female exam.      Plan:      Problem List Items Addressed This Visit      Unprioritized   Anxiety (Chronic)   Relevant Medications   buPROPion (WELLBUTRIN SR) 12 hr tablet   RESOLVED: Encounter for routine gynecological examination - Primary   Relevant Orders   CBC   TSH   Lipid panel   Comprehensive metabolic panel   Cytology - PAP   General counseling for prescription of oral contraceptives    Other Visit Diagnoses    Screening for malignant neoplasm of cervix        Relevant Orders    Cytology - PAP    Encounter for surveillance of contraceptive pills [Z30.41]        Encounter for surveillance of contraceptives        Relevant Medications    norgestimate-ethinyl estradiol (SPRINTEC 28) 0.25-35 MG-MCG tablet       See After Visit Summary for Counseling Recommendations

## 2014-09-04 LAB — CYTOLOGY - PAP

## 2014-10-31 NOTE — Op Note (Signed)
PATIENT NAME:  Kerry [name], Kerry [name] MR#:  [id number] DATE OF BIRTH:  [birth year]  DATE OF PROCEDURE:  07/04/2013  PREOPERATIVE DIAGNOSIS: Hallux valgus deformity, right foot.   POSTOPERATIVE DIAGNOSIS: Hallux valgus deformity, right foot.   PROCEDURE PERFORMED: Hallux valgus correction, right foot.   SURGEON: Sharlotte Alamo, DPM  ANESTHESIA: LMA.   HEMOSTASIS: Pneumatic tourniquet, right calf, 250 mmHg.   ESTIMATED BLOOD LOSS: Minimal.   PATHOLOGY: None.   MATERIALS: One 2.3 cortical screw, [employer], 20 mm length.   COMPLICATIONS: None apparent.   OPERATIVE INDICATIONS: This is a 29 year old female with a history of pain with a bunion on her right foot. Wider shoes and conservative care proved unsatisfactory and she elects for surgical correction.   DESCRIPTION OF PROCEDURE: The patient was taken to the operating room and placed on the table in the supine position. Following satisfactory sedation and LMA anesthesia, the right foot was anesthetized with 10 mL of 0.5% Sensorcaine plain around the first metatarsal. Pneumatic tourniquet was applied at the level of the right calf and the foot was prepped and draped in the usual sterile fashion. The foot was exsanguinated and the tourniquet inflated to 250 mmHg.   Attention was then directed to the dorsomedial aspect of the right foot where an approximate 4 cm linear incision was made coursing proximal to distal, centered over the first metatarsal and metatarsophalangeal joint. The incision was deepened via sharp and blunt dissection down to the level of the joint where the medial and dorsal capsular and periosteal tissues were reflected off of the first metatarsal. The medial eminence was resected using a pneumatic saw. A 0.045 inch K wire was then driven from medial to lateral as an axis guide and a V-shaped osteotomy was performed through the head of the first metatarsal. The head was freely mobilized. The head was mobilized laterally and there was  noted to be a good rectus alignment of the great toe with no real constricture on the toe. The osteotomy was then fixated temporarily using a 0.045 inch K wire and then using AO technique. A 2.3 mm Medartis screw was placed across the osteotomy with over drilling of the proximal hole. Intraoperative FluoroScan views revealed good reduction of the deformity and placement of the screw. The wire was removed and the medial eminence was resected using a pneumatic saw. The wound was flushed with copious amounts of sterile saline. The edges were rasped smooth and flushed again. Intraoperative FluoroScan views were again taken which revealed good alignment of the first ray. The incision was then closed using 4-0 Vicryl running suture for all layers from periosteal and capsular closure to deep and superficial subcutaneous and skin closure. Tincture of benzoin and Steri-Strips were applied to the incision followed by Xeroform and a sterile gauze. The tourniquet was released and blood flow noted to return immediately to the right foot in digits 1 through 5. A posterior splint was then applied to the right leg with the foot in 90 degrees relative to the leg. The patient was awakened and transported to the PAC-U with vital signs stable and in good condition.  ____________________________ Sharlotte Alamo, DPM tc:sb D: 07/04/2013 12:58:16 ET T: 07/04/2013 13:24:02 ET JOB#: [id number]  cc: Sharlotte Alamo, DPM, <Dictator> Layci Stenglein DPM ELECTRONICALLY SIGNED 07/21/2013 16:06

## 2014-11-11 ENCOUNTER — Telehealth: Payer: Self-pay | Admitting: *Deleted

## 2014-11-11 MED ORDER — NORETHIN-ETH ESTRAD-FE BIPHAS 1 MG-10 MCG / 10 MCG PO TABS: 1.0000 | ORAL_TABLET | Freq: Every day | ORAL | Status: DC

## 2014-11-11 NOTE — Telephone Encounter (Signed)
Patient wishes to change to a lower dose ocp.

## 2015-03-29 ENCOUNTER — Ambulatory Visit (INDEPENDENT_AMBULATORY_CARE_PROVIDER_SITE_OTHER): Payer: PRIVATE HEALTH INSURANCE | Admitting: Obstetrics & Gynecology

## 2015-03-29 ENCOUNTER — Encounter: Payer: Self-pay | Admitting: Obstetrics & Gynecology

## 2015-03-29 VITALS — BP 124/80 | HR 85 | Wt 188.0 lb

## 2015-03-29 DIAGNOSIS — A499 Bacterial infection, unspecified: Secondary | ICD-10-CM

## 2015-03-29 DIAGNOSIS — N760 Acute vaginitis: Secondary | ICD-10-CM | POA: Diagnosis not present

## 2015-03-29 DIAGNOSIS — B9689 Other specified bacterial agents as the cause of diseases classified elsewhere: Secondary | ICD-10-CM

## 2015-03-29 DIAGNOSIS — R300 Dysuria: Secondary | ICD-10-CM | POA: Diagnosis not present

## 2015-03-29 DIAGNOSIS — Z113 Encounter for screening for infections with a predominantly sexual mode of transmission: Secondary | ICD-10-CM | POA: Diagnosis not present

## 2015-03-29 DIAGNOSIS — N898 Other specified noninflammatory disorders of vagina: Secondary | ICD-10-CM | POA: Diagnosis not present

## 2015-03-29 LAB — POCT URINALYSIS DIPSTICK
BILIRUBIN UA: NEGATIVE
Blood, UA: NEGATIVE
GLUCOSE UA: NEGATIVE
Ketones, UA: NEGATIVE
Leukocytes, UA: NEGATIVE
Nitrite, UA: NEGATIVE
Protein, UA: NEGATIVE
Urobilinogen, UA: NEGATIVE
pH, UA: 5

## 2015-03-29 NOTE — Patient Instructions (Signed)
Return to clinic for any scheduled appointments or for any gynecologic concerns as needed.   

## 2015-03-29 NOTE — Progress Notes (Signed)
   CLINIC ENCOUNTER NOTE  History:  29 y.o. G1P0101 here today for evaluation of dysuria and pruritic vaginal discharge for past 7 days. She denies any abnormal vaginal, bleeding, pelvic pain or other concerns.   Past Medical History  Diagnosis Date  . Post partum depression     Past Surgical History  Procedure Laterality Date  . Right foot surgery  2014   The following portions of the patient's history were reviewed and updated as appropriate: allergies, current medications, past family history, past medical history, past social history, past surgical history and problem list.   Health Maintenance:  Normal pap on 09/02/14.    Review of Systems:  Pertinent items are noted in HPI. Comprehensive review of systems was otherwise negative.  Objective:  Physical Exam BP 124/80 mmHg  Pulse 85  Wt 188 lb (85.276 kg)  LMP 03/15/2015 CONSTITUTIONAL: Well-developed, well-nourished female in no acute distress.  HENT:  Normocephalic, atraumatic. External right and left ear normal. Oropharynx is clear and moist EYES: Conjunctivae and EOM are normal. Pupils are equal, round, and reactive to light. No scleral icterus.  NECK: Normal range of motion, supple, no masses SKIN: Skin is warm and dry. No rash noted. Not diaphoretic. No erythema. No pallor. [place]: Alert and oriented to person, place, and time. Normal reflexes, muscle tone coordination. No cranial nerve deficit noted. PSYCHIATRIC: Normal mood and affect. Normal behavior. Normal judgment and thought content. CARDIOVASCULAR: Normal heart rate noted RESPIRATORY: Effort and breath sounds normal, no problems with respiration noted ABDOMEN: Soft, no distention noted, no suprapubic tenderness   PELVIC: Normal appearing external genitalia; normal appearing vaginal mucosa and cervix. Scant clear-white discharge noted; samples obtained for testing.  Normal uterine size, no other palpable masses, no uterine or adnexal  tenderness. MUSCULOSKELETAL: Normal range of motion. No edema noted.  Labs and Imaging Results for orders placed or performed in visit on 03/29/15 (from the past 24 hour(s))  POCT Urinalysis Dipstick     Status: Normal   Collection Time: 03/29/15  4:13 PM  Result Value Ref Range   Color, UA yellow    Clarity, UA clear    Glucose, UA negative    Bilirubin, UA negative    Ketones, UA negative    Spec Grav, UA <=1.005    Blood, UA negative    pH, UA 5.0    Protein, UA negative    Urobilinogen, UA negative    Nitrite, UA negative    Leukocytes, UA Negative Negative     Assessment & Plan:  UA negative, will follow up wet prep and GC/Chlam and manage accordingly Routine preventative health maintenance measures emphasized. Please refer to After Visit Summary for other counseling recommendations.     Verita Schneiders, MD, [place] Attending Obstetrician & Gynecologist, [place] [facility]

## 2015-03-30 ENCOUNTER — Telehealth: Payer: Self-pay | Admitting: *Deleted

## 2015-03-30 LAB — GC/CHLAMYDIA PROBE AMP
CT PROBE, AMP APTIMA: NEGATIVE
GC Probe RNA: NEGATIVE

## 2015-03-30 LAB — WET PREP, GENITAL
Trich, Wet Prep: NONE SEEN
WBC WET PREP: NONE SEEN
Yeast Wet Prep HPF POC: NONE SEEN

## 2015-03-30 MED ORDER — METRONIDAZOLE 500 MG PO TABS: 500.0000 mg | ORAL_TABLET | Freq: Two times a day (BID) | ORAL | Status: DC

## 2015-03-30 NOTE — Addendum Note (Signed)
Addended by: Verita Schneiders A on: 03/30/2015 09:44 AM   Modules accepted: Orders

## 2015-03-30 NOTE — Telephone Encounter (Signed)
-----   Message from Osborne Oman, MD sent at 03/30/2015  9:42 AM EDT ----- Wet prep is abnormal and showed bacterial vaginitis. Metronidazole was prescribed. Please inform patient of results and advise to pick up prescription.

## 2015-03-30 NOTE — Telephone Encounter (Signed)
Informed pt of wet prep result and that rx had been called to pharmacy.  Instructed on use and to complete the rx.  Pt acknowledged understanding.

## 2015-07-26 ENCOUNTER — Encounter: Payer: Self-pay | Admitting: Physician Assistant

## 2015-07-26 ENCOUNTER — Ambulatory Visit: Payer: Self-pay | Admitting: Physician Assistant

## 2015-07-26 VITALS — BP 110/70 | HR 99 | Temp 98.3°F

## 2015-07-26 DIAGNOSIS — B085 Enteroviral vesicular pharyngitis: Secondary | ICD-10-CM

## 2015-07-26 DIAGNOSIS — J069 Acute upper respiratory infection, unspecified: Secondary | ICD-10-CM

## 2015-07-26 MED ORDER — AZITHROMYCIN 250 MG PO TABS: ORAL_TABLET | ORAL | Status: DC

## 2015-07-26 MED ORDER — MAGIC MOUTHWASH W/LIDOCAINE: 5.0000 mL | Freq: Three times a day (TID) | ORAL | Status: DC

## 2015-07-26 NOTE — Patient Instructions (Signed)

## 2015-07-26 NOTE — Progress Notes (Signed)
S: c/o runny nose congestion, sore throat since FRiday, some cough, no known fever today, no cp/sob/v/d; using otc meds, hurts really bad to swallow and noticed a white spot in the back of her throat  O: vitals wnl, nad, tms clear, nasal mucosa red and swollen with yellow mucus, throat red swollen with ulcer on left tonsil, neck supple no lymph, lungs c t a, cv rrr  A: acute uri, pharyngitis  P: zpack for uri, dukes with lidocaine for pharyngitis

## 2015-09-06 ENCOUNTER — Ambulatory Visit (INDEPENDENT_AMBULATORY_CARE_PROVIDER_SITE_OTHER): Payer: Managed Care, Other (non HMO) | Admitting: Obstetrics and Gynecology

## 2015-09-06 ENCOUNTER — Encounter: Payer: Self-pay | Admitting: Obstetrics and Gynecology

## 2015-09-06 VITALS — BP 106/72 | HR 101 | Resp 18 | Ht 63.0 in | Wt 194.0 lb

## 2015-09-06 DIAGNOSIS — Z01419 Encounter for gynecological examination (general) (routine) without abnormal findings: Secondary | ICD-10-CM

## 2015-09-06 DIAGNOSIS — Z124 Encounter for screening for malignant neoplasm of cervix: Secondary | ICD-10-CM

## 2015-09-06 DIAGNOSIS — Z1151 Encounter for screening for human papillomavirus (HPV): Secondary | ICD-10-CM | POA: Diagnosis not present

## 2015-09-06 NOTE — Progress Notes (Signed)
  Subjective:     Kerry [name] is a 30 y.o. female G58P0101 with BMI 34 who is here for a comprehensive physical exam. The patient reports no problems. She is sexually active using OCP for contraception. She is planning on conceiving in the next 12 months or so.  Past Medical History  Diagnosis Date  . Post partum depression    Past Surgical History  Procedure Laterality Date  . Right foot surgery  2014   Family History  Problem Relation Age of Onset  . Hypertension Maternal Grandmother   . Hypertension Maternal Grandfather   . Hypertension Father   . Gout Father   . Hypertension Mother   . Breast cancer Maternal Aunt 38    Social History   Social History  . Marital Status: Married    Spouse Name: N/A  . Number of Children: N/A  . Years of Education: N/A   Occupational History  . Not on file.   Social History Main Topics  . Smoking status: Never Smoker   . Smokeless tobacco: Never Used  . Alcohol Use: Yes     Comment: rare  . Drug Use: No  . Sexual Activity:    Partners: Male    Birth Control/ Protection: OCP, Pill   Other Topics Concern  . Not on file   Social History Narrative   Health Maintenance  Topic Date Due  . Samul Dada  07/27/2006  . INFLUENZA VACCINE  02/08/2015  . PAP SMEAR  09/02/2017  . HIV Screening  Completed     Review of Systems Pertinent items are noted in HPI.   Objective:     Blood pressure 106/72, pulse 101, resp. rate 18, height 5\' 3"  (1.6 m), weight 194 lb (87.998 kg), last menstrual period 08/06/2015. GENERAL: Well-developed, well-nourished female in no acute distress.  HEENT: Normocephalic, atraumatic. Sclerae anicteric.  NECK: Supple. Normal thyroid.  LUNGS: Clear to auscultation bilaterally.  HEART: Regular rate and rhythm. BREASTS: Symmetric in size. No palpable masses or lymphadenopathy, skin changes, or nipple drainage. ABDOMEN: Soft, nontender, nondistended. No organomegaly. PELVIC: Normal external female  genitalia. Vagina is pink and rugated.  Normal discharge. Normal appearing cervix. Uterus is normal in size. No adnexal mass or tenderness. EXTREMITIES: No cyanosis, clubbing, or edema, 2+ distal pulses.    Assessment:    Healthy female exam.      Plan:    Pap smear collected Patient will be contacted with any abnormal results Start prenatal vitamins Optimal timing for conception discussed RTC in 1 year or prn See After Visit Summary for Counseling Recommendations

## 2015-09-08 LAB — CYTOLOGY - PAP

## 2015-09-20 ENCOUNTER — Telehealth: Payer: Self-pay | Admitting: *Deleted

## 2015-09-20 DIAGNOSIS — Z3040 Encounter for surveillance of contraceptives, unspecified: Secondary | ICD-10-CM

## 2015-09-20 MED ORDER — NORGESTIMATE-ETH ESTRADIOL 0.25-35 MG-MCG PO TABS: 1.0000 | ORAL_TABLET | Freq: Every day | ORAL | Status: DC

## 2015-09-20 NOTE — Telephone Encounter (Signed)
-----   Message from Francia Greaves sent at 09/20/2015  4:39 PM EDT ----- Regarding: Refill Request Called and needs a refill on her birth control

## 2015-09-20 NOTE — Telephone Encounter (Signed)
Last physical 09-06-15, sent birth control to [facility].

## 2015-12-17 ENCOUNTER — Ambulatory Visit: Payer: Self-pay | Admitting: Physician Assistant

## 2015-12-17 ENCOUNTER — Encounter: Payer: Self-pay | Admitting: Physician Assistant

## 2015-12-17 VITALS — BP 115/70 | HR 87 | Temp 98.8°F

## 2015-12-17 DIAGNOSIS — J069 Acute upper respiratory infection, unspecified: Secondary | ICD-10-CM

## 2015-12-17 MED ORDER — PSEUDOEPH-BROMPHEN-DM 30-2-10 MG/5ML PO SYRP: 5.0000 mL | ORAL_SOLUTION | Freq: Four times a day (QID) | ORAL | Status: DC | PRN

## 2015-12-17 NOTE — Progress Notes (Signed)
   Subjective:cough    Patient ID: Kerry [name], female    DOB: Jan 14, 1986, 30 y.o.   MRN: [id number]  HPI Patient c/o running nose, post nasal drainage, and cough for 3 days. Cough worse at night. Cough is non-productive. Denies Fever/chill, or N/V/D. No palliative measure for complaint.   Review of Systems    Negative except for compliant. Objective:   Physical Exam  No acute distress. Edematous bilateral nasal turbinates with clear rhinorrhea. Post nasal drainage. Neck supple w/o  Adenopathy.Lungs CTA. Heart RRR.      Assessment & Plan:URI  Take Bromfed DM as directed and follow up in 3 days if no improvement.

## 2016-02-25 ENCOUNTER — Encounter: Payer: Self-pay | Admitting: Physician Assistant

## 2016-02-25 ENCOUNTER — Ambulatory Visit: Payer: Self-pay | Admitting: Physician Assistant

## 2016-02-25 VITALS — BP 109/70 | HR 105 | Temp 98.3°F

## 2016-02-25 DIAGNOSIS — N926 Irregular menstruation, unspecified: Secondary | ICD-10-CM

## 2016-02-25 LAB — POCT URINE PREGNANCY: PREG TEST UR: POSITIVE — AB

## 2016-02-25 NOTE — Progress Notes (Signed)
S: c/o itching in ears, no pain, using q tips a lot, also ?if preg, lmp was July 27  O: vitals wnl, nad, tms clear, ear canals wnl, lungs c t a, cvrrr, urine preg initially neg but rma rechecked before throwing away and had a faint line for +  A: ?pregnancy,   P: beta hcg blood work drawn, reassurance about ears as they do not appear infected, just a little dry

## 2016-02-26 LAB — HCG, SERUM, QUALITATIVE: hCG,Beta Subunit,Qual,Serum: POSITIVE m[IU]/mL — AB (ref <6)

## 2016-02-29 LAB — SPECIMEN STATUS REPORT

## 2016-02-29 LAB — BETA HCG QUANT (REF LAB): HCG QUANT: 224 m[IU]/mL

## 2016-03-20 ENCOUNTER — Encounter: Payer: Self-pay | Admitting: Emergency Medicine

## 2016-03-20 ENCOUNTER — Emergency Department: Payer: Managed Care, Other (non HMO)

## 2016-03-20 ENCOUNTER — Emergency Department
Admission: EM | Admit: 2016-03-20 | Discharge: 2016-03-21 | Disposition: A | Discharge status: Home / Self Care | Payer: Managed Care, Other (non HMO) | Attending: Emergency Medicine | Admitting: Emergency Medicine

## 2016-03-20 DIAGNOSIS — O209 Hemorrhage in early pregnancy, unspecified: Secondary | ICD-10-CM | POA: Diagnosis present

## 2016-03-20 DIAGNOSIS — O200 Threatened abortion: Secondary | ICD-10-CM | POA: Insufficient documentation

## 2016-03-20 DIAGNOSIS — Z3A08 8 weeks gestation of pregnancy: Secondary | ICD-10-CM | POA: Insufficient documentation

## 2016-03-20 DIAGNOSIS — O208 Other hemorrhage in early pregnancy: Secondary | ICD-10-CM | POA: Diagnosis not present

## 2016-03-20 DIAGNOSIS — O468X1 Other antepartum hemorrhage, first trimester: Secondary | ICD-10-CM

## 2016-03-20 DIAGNOSIS — O418X10 Other specified disorders of amniotic fluid and membranes, first trimester, not applicable or unspecified: Secondary | ICD-10-CM

## 2016-03-20 LAB — URINALYSIS COMPLETE WITH MICROSCOPIC (ARMC ONLY)
BACTERIA UA: NONE SEEN
Bilirubin Urine: NEGATIVE
Glucose, UA: NEGATIVE mg/dL
Ketones, ur: NEGATIVE mg/dL
LEUKOCYTES UA: NEGATIVE
NITRITE: NEGATIVE
PROTEIN: NEGATIVE mg/dL
SPECIFIC GRAVITY, URINE: 1.021 (ref 1.005–1.030)
WBC UA: NONE SEEN WBC/hpf (ref 0–5)
pH: 6 (ref 5.0–8.0)

## 2016-03-20 LAB — CBC
HEMATOCRIT: 36.9 % (ref 35.0–47.0)
HEMOGLOBIN: 13.2 g/dL (ref 12.0–16.0)
MCH: 33.8 pg (ref 26.0–34.0)
MCHC: 35.9 g/dL (ref 32.0–36.0)
MCV: 94.3 fL (ref 80.0–100.0)
Platelets: 293 10*3/uL (ref 150–440)
RBC: 3.91 MIL/uL (ref 3.80–5.20)
RDW: 13 % (ref 11.5–14.5)
WBC: 10.1 10*3/uL (ref 3.6–11.0)

## 2016-03-20 LAB — COMPREHENSIVE METABOLIC PANEL
ALK PHOS: 60 U/L (ref 38–126)
ALT: 14 U/L (ref 14–54)
ANION GAP: 7 (ref 5–15)
AST: 18 U/L (ref 15–41)
Albumin: 3.9 g/dL (ref 3.5–5.0)
BUN: 9 mg/dL (ref 6–20)
CHLORIDE: 104 mmol/L (ref 101–111)
CO2: 24 mmol/L (ref 22–32)
Calcium: 9.6 mg/dL (ref 8.9–10.3)
Creatinine, Ser: 0.68 mg/dL (ref 0.44–1.00)
GFR calc Af Amer: >60 mL/min (ref >60)
GFR calc non Af Amer: >60 mL/min (ref >60)
GLUCOSE: 91 mg/dL (ref 65–99)
POTASSIUM: 3.7 mmol/L (ref 3.5–5.1)
Sodium: 135 mmol/L (ref 135–145)
TOTAL PROTEIN: 7.2 g/dL (ref 6.5–8.1)
Total Bilirubin: 0.3 mg/dL (ref 0.3–1.2)

## 2016-03-20 LAB — ABO/RH: ABO/RH(D): O POS

## 2016-03-20 LAB — HCG, QUANTITATIVE, PREGNANCY: hCG, Beta Chain, Quant, S: 124554 m[IU]/mL — ABNORMAL HIGH (ref <5)

## 2016-03-20 NOTE — ED Notes (Signed)
Patient is back from ultra sound.

## 2016-03-20 NOTE — ED Triage Notes (Signed)
Pt ambulatory to triage with steady gait, reports is [redacted] weeks pregnant. Pt reports noticed vaginal bleeding today with blood clots, reports dark brown blood at first and now is having bright red blood. Pt denies abdominal pain, dizziness, or weakness. G2P1. Pt alert and oriented x 4. No increased work in breathing noted.

## 2016-03-21 LAB — WET PREP, GENITAL
Clue Cells Wet Prep HPF POC: NONE SEEN
SPERM: NONE SEEN
TRICH WET PREP: NONE SEEN
Yeast Wet Prep HPF POC: NONE SEEN

## 2016-03-21 LAB — CHLAMYDIA/NGC RT PCR (ARMC ONLY)
CHLAMYDIA TR: NOT DETECTED
N GONORRHOEAE: NOT DETECTED

## 2016-03-21 NOTE — ED Notes (Signed)
Discharge instructions reviewed with patient. Patient verbalized understanding. Patient ambulated to lobby without difficulty.   

## 2016-03-21 NOTE — ED Provider Notes (Signed)
[facility] Emergency Department Provider Note   ____________________________________________   First MD Initiated Contact with Patient 03/20/16 2313     (approximate)  I have reviewed the triage vital signs and the nursing notes.   HISTORY  Chief Complaint Vaginal Bleeding    HPI Kerry [name] is a 30 y.o. female who comes into the hospital today with vaginal bleeding. She reports that she felt a gush of blood with some clots that came on around 3 PM. She reports that it only occurred at that time and she is not bleeding anymore. The patient is [redacted] weeks pregnant. She denies any abdominal pain and has not yet seen an OB. The patient reports that she has an OB appointment on September 27. The patient is a G2 P0 101. She's had some nausea with some vomiting. She denies any fevers, pain with urination. She has had some palms or constipation with bowel movements as she's been taking iron and prenatal vitamins. The patient is here today for evaluation.   Past Medical History:  Diagnosis Date  . Post partum depression     Patient Active Problem List   Diagnosis Date Noted  . Anxiety 04/26/2011    Past Surgical History:  Procedure Laterality Date  . right foot surgery  2014    Prior to Admission medications   Not on File    Allergies Review of patient's allergies indicates no known allergies.  Family History  Problem Relation Age of Onset  . Hypertension Father   . Gout Father   . Hypertension Mother   . Breast cancer Maternal Aunt 38  . Hypertension Maternal Grandmother   . Hypertension Maternal Grandfather     Social History Social History  Substance Use Topics  . Smoking status: Never Smoker  . Smokeless tobacco: Never Used  . Alcohol use No     Comment: rare    Review of Systems Constitutional: No fever/chills Eyes: No visual changes. ENT: No sore throat. Cardiovascular: Denies chest pain. Respiratory: Denies shortness of  breath. Gastrointestinal: No abdominal pain.  No nausea, no vomiting.  No diarrhea.  No constipation. Genitourinary:Vaginal bleeding Musculoskeletal: Negative for back pain. Skin: Negative for rash. Neurological: Negative for headaches, focal weakness or numbness.  10-point ROS otherwise negative.  ____________________________________________   PHYSICAL EXAM:  VITAL SIGNS: ED Triage Vitals [03/20/16 2051]  Enc Vitals Group     BP 131/82     Pulse Rate 80     Resp 18     Temp 98.8 F (37.1 C)     Temp Source Oral     SpO2 100 %     Weight 190 lb (86.2 kg)     Height 5\' 3"  (1.6 m)     Head Circumference      Peak Flow      Pain Score      Pain Loc      Pain Edu?      Excl. in [place]?     Constitutional: Alert and oriented. Well appearing and in Mild distress. Eyes: Conjunctivae are normal. PERRL. EOMI. Head: Atraumatic. Nose: No congestion/rhinnorhea. Mouth/Throat: Mucous membranes are moist.  Oropharynx non-erythematous. Cardiovascular: Normal rate, regular rhythm. Grossly normal heart sounds.  Good peripheral circulation. Respiratory: Normal respiratory effort.  No retractions. Lungs CTAB. Gastrointestinal: Soft and nontender. No distention. Positive bowel sounds Genitourinary: Normal external genitalia some brownish discharge in the vaginal vault no cervical motion tenderness, cervix is closed, no abdominal or adnexal tenderness to palpation.  Musculoskeletal: No lower extremity tenderness nor edema.   Neurologic:  Normal speech and language.  Skin:  Skin is warm, dry and intact. Marland Kitchen Psychiatric: Mood and affect are normal.   ____________________________________________   LABS (all labs ordered are listed, but only abnormal results are displayed)  Labs Reviewed  WET PREP, GENITAL - Abnormal; Notable for the following:       Result Value   WBC, Wet Prep HPF POC MODERATE (*)    All other components within normal limits  HCG, QUANTITATIVE, PREGNANCY - Abnormal;  Notable for the following:    hCG, Beta Chain, Quant, S 124,554 (*)    All other components within normal limits  URINALYSIS COMPLETEWITH MICROSCOPIC ([facility] ONLY) - Abnormal; Notable for the following:    Color, Urine YELLOW (*)    APPearance CLEAR (*)    Hgb urine dipstick 1+ (*)    Squamous Epithelial / LPF 0-5 (*)    All other components within normal limits  CHLAMYDIA/NGC RT PCR ([facility] ONLY)  COMPREHENSIVE METABOLIC PANEL  CBC  ABO/RH   ____________________________________________  EKG  None ____________________________________________  RADIOLOGY  Pelvic ultrasound ____________________________________________   PROCEDURES  Procedure(s) performed: None  Procedures  Critical Care performed: No  ____________________________________________   INITIAL IMPRESSION / ASSESSMENT AND PLAN / ED COURSE  Pertinent labs & imaging results that were available during my care of the patient were reviewed by me and considered in my medical decision making (see chart for details).  This is a 30 year old female who comes into the hospital today with vaginal bleeding. The patient is [redacted] weeks pregnant. The patient did have an ultrasound as well as some blood work drawn. The patient's blood type is O+. Her pelvic exam showed a negative wet prep. I will assess the patient's ultrasound and then reassess the patient.  Clinical Course  Value Comment By Time  US OB Comp Less 14 Wks 1. Single live intrauterine pregnancy estimated gestational age [redacted] weeks 4 days for estimated date of delivery 11/02/2016. Small subchorionic hemorrhage. 2. The gestational sac appears situated low in the endometrial canal just above the endocervical junction. Close clinical follow-up is recommended, follow-up ultrasound as indicated clinically.   Loney Hering, MD 09/11 2318    She had no pain while she was here in the emergency department. The patient's pregnancy has a heartbeat of 155 bpm. She  does have a subchorionic hemorrhage. I will discharge the patient to have her follow-up with her OB/GYN. The patient has no further concerns at this time. She'll be discharged home. ____________________________________________   FINAL CLINICAL IMPRESSION(S) / ED DIAGNOSES  Final diagnoses:  Threatened miscarriage  Subchorionic hemorrhage, first trimester      NEW MEDICATIONS STARTED DURING THIS VISIT:  New Prescriptions   No medications on file     Note:  This document was prepared using Dragon voice recognition software and may include unintentional dictation errors.    Loney Hering, MD 03/21/16 [phone]

## 2016-03-30 ENCOUNTER — Ambulatory Visit (INDEPENDENT_AMBULATORY_CARE_PROVIDER_SITE_OTHER): Payer: Managed Care, Other (non HMO) | Admitting: Obstetrics & Gynecology

## 2016-03-30 ENCOUNTER — Encounter: Payer: Self-pay | Admitting: Obstetrics & Gynecology

## 2016-03-30 ENCOUNTER — Encounter: Payer: Self-pay | Admitting: *Deleted

## 2016-03-30 DIAGNOSIS — Z113 Encounter for screening for infections with a predominantly sexual mode of transmission: Secondary | ICD-10-CM | POA: Diagnosis not present

## 2016-03-30 DIAGNOSIS — O09211 Supervision of pregnancy with history of pre-term labor, first trimester: Secondary | ICD-10-CM

## 2016-03-30 DIAGNOSIS — O9989 Other specified diseases and conditions complicating pregnancy, childbirth and the puerperium: Secondary | ICD-10-CM

## 2016-03-30 DIAGNOSIS — Z8659 Personal history of other mental and behavioral disorders: Secondary | ICD-10-CM | POA: Insufficient documentation

## 2016-03-30 DIAGNOSIS — O0991 Supervision of high risk pregnancy, unspecified, first trimester: Secondary | ICD-10-CM

## 2016-03-30 DIAGNOSIS — O0990 Supervision of high risk pregnancy, unspecified, unspecified trimester: Secondary | ICD-10-CM | POA: Insufficient documentation

## 2016-03-30 DIAGNOSIS — O09219 Supervision of pregnancy with history of pre-term labor, unspecified trimester: Secondary | ICD-10-CM | POA: Insufficient documentation

## 2016-03-30 NOTE — Patient Instructions (Signed)
Thank you for enrolling in [facility]. Please follow the instructions below to securely access your online medical record. MyChart allows you to send messages to your doctor, view your test results, manage appointments, and more.   How Do I Sign Up? 1. In your Internet browser, go to [employer] and enter [email or web address]. 2. Click on the Sign Up Now link in the Sign In box. You will see the New Member Sign Up page. 3. Enter your MyChart Access Code exactly as it appears below. You will not need to use this code after you've completed the sign-up process. If you do not sign up before the expiration date, you must request a new code.  MyChart Access Code: [email or web address] Expires: 05/20/2016  4:31 PM  4. Enter your Social Security Number ([id number]) and Date of Birth (mm/dd/yyyy) as indicated and click Submit. You will be taken to the next sign-up page. 5. Create a MyChart ID. This will be your MyChart login ID and cannot be changed, so think of one that is secure and easy to remember. 6. Create a MyChart password. You can change your password at any time. 7. Enter your Password Reset Question and Answer. This can be used at a later time if you forget your password.  8. Enter your e-mail address. You will receive e-mail notification when new information is available in [place]. 9. Click Sign Up. You can now view your medical record.   Additional Information Remember, MyChart is NOT to be used for urgent needs. For medical emergencies, dial 911.

## 2016-03-30 NOTE — Progress Notes (Signed)
Subjective:   Kerry [name] is a 30 y.o. G2P0101 [redacted]w[redacted]d by ultrasound at [redacted] weeks GA being seen today for her first obstetrical visit.  Her obstetrical history is significant for preterm delivery and h/o postpartum depression. Patient does intend to breast feed. Pregnancy history fully reviewed.  Patient reports no complaints. Had normal exam and pap smear in 08/2015.  HISTORY: Obstetric History   G2   P1   T0   P1   A0   L1    SAB0   TAB0   Ectopic0   Multiple0   Live Births1     # Outcome Date GA Lbr Len/2nd Weight Sex Delivery Anes PTL Lv  2 Current           1 Preterm 10/15/10 [redacted]w[redacted]d  5 lb 3 oz (2.353 kg) F Vag-Spont  Y LIV     Past Medical History:  Diagnosis Date  . Anxiety 04/26/2011  . Post partum depression    Past Surgical History:  Procedure Laterality Date  . right foot surgery  2014   Family History  Problem Relation Age of Onset  . Hypertension Father   . Gout Father   . Hypertension Mother   . Breast cancer Maternal Aunt 38  . Cancer Maternal Aunt 37    breast  . Hypertension Maternal Grandmother   . Hypertension Maternal Grandfather    Social History  Substance Use Topics  . Smoking status: Never Smoker  . Smokeless tobacco: Never Used  . Alcohol use No     Comment: rare   No Known Allergies  Outpatient Encounter Prescriptions as of 03/30/2016  Medication Sig  . Prenatal Vit-Fe Fumarate-FA (PRENATAL VITAMIN PO) Take 1 tablet by mouth daily.  . [DISCONTINUED] tretinoin (RETIN-A) 0.025 % cream Apply 1 application topically at bedtime.   No facility-administered encounter medications on file as of 03/30/2016.     Exam   Vitals:   03/30/16 0848  BP: 99/66  Pulse: (!) 111  Weight: 200 lb (90.7 kg)   Fetal Heart Rate (bpm): 173  Uterus:     Pelvic Exam: Deferred  System: Breast:  Deferred   Skin: normal coloration and turgor, no rashes   Neurologic: oriented, normal, negative, normal mood   Extremities: normal strength, tone, and muscle  mass, ROM of all joints is normal   HEENT PERRLA, extra ocular movement intact and sclera clear, anicteric   Mouth/Teeth mucous membranes moist, pharynx normal without lesions and dental hygiene good   Neck supple and no masses   Cardiovascular: regular rate and rhythm   Respiratory:  appears well, vitals normal, no respiratory distress, acyanotic, normal RR   Abdomen: soft, non-tender; bowel sounds normal; no masses,  no organomegaly     Assessment:   Pregnancy: [id number] Patient Active Problem List   Diagnosis Date Noted  . Previous preterm delivery, antepartum 03/30/2016  . Supervision of high-risk pregnancy 03/30/2016  . History of postpartum depression, currently pregnant 03/30/2016  . Anxiety 04/26/2011        Plan:  1. Previous preterm delivery, antepartum, unspecified trimester Counseled about 17P, she declined this option  2. History of postpartum depression, currently pregnant Will continue to monitor  3. Supervision of high-risk pregnancy, first trimester Initial labs drawn. Continue prenatal vitamins. Problem list reviewed and updated. Genetic Screening discussed First Screen, Integrated Screen and Quad Screen: declined. Ultrasound discussed; fetal anatomic surveto be ordered laterto be ordered later. The nature of [facility] with  multiple MDs and other Advanced Practice Providers was explained to patient; also emphasized that residents, students are part of our team. Routine obstetric precautions reviewed. Follow up in 4 weeks.    Verita Schneiders, MD, [place] Attending [place], [facility] for [employer], [place]

## 2016-03-30 NOTE — Progress Notes (Signed)
Abdominal US performed to verify FHR, SIUP noted with + FHR = 173

## 2016-03-31 LAB — PAIN MGMT, PROFILE 6 CONF W/O MM, U
6 Acetylmorphine: NEGATIVE ng/mL (ref <10)
AMPHETAMINES: NEGATIVE ng/mL (ref <500)
Alcohol Metabolites: NEGATIVE ng/mL (ref <500)
BARBITURATES: NEGATIVE ng/mL (ref <300)
Benzodiazepines: NEGATIVE ng/mL (ref <100)
COCAINE METABOLITE: NEGATIVE ng/mL (ref <150)
CREATININE: 182.1 mg/dL (ref >=20.0)
MARIJUANA METABOLITE: NEGATIVE ng/mL (ref <20)
Methadone Metabolite: NEGATIVE ng/mL (ref <100)
OPIATES: NEGATIVE ng/mL (ref <100)
OXIDANT: NEGATIVE ug/mL (ref <200)
OXYCODONE: NEGATIVE ng/mL (ref <100)
Phencyclidine: NEGATIVE ng/mL (ref <25)
Please note:: 0
pH: 6.46 (ref 4.5–9.0)

## 2016-03-31 LAB — GC/CHLAMYDIA PROBE AMP (~~LOC~~) NOT AT ARMC
CHLAMYDIA, DNA PROBE: NEGATIVE
NEISSERIA GONORRHEA: NEGATIVE

## 2016-04-01 LAB — CULTURE, OB URINE
Colony Count: NO GROWTH
ORGANISM ID, BACTERIA: NO GROWTH

## 2016-04-05 ENCOUNTER — Encounter: Payer: Self-pay | Admitting: Obstetrics & Gynecology

## 2016-04-28 ENCOUNTER — Encounter: Payer: Self-pay | Admitting: Obstetrics and Gynecology

## 2016-05-01 ENCOUNTER — Ambulatory Visit (INDEPENDENT_AMBULATORY_CARE_PROVIDER_SITE_OTHER): Payer: Managed Care, Other (non HMO) | Admitting: Obstetrics & Gynecology

## 2016-05-01 VITALS — BP 95/67 | HR 112 | Wt 204.0 lb

## 2016-05-01 DIAGNOSIS — F419 Anxiety disorder, unspecified: Secondary | ICD-10-CM

## 2016-05-01 DIAGNOSIS — O09211 Supervision of pregnancy with history of pre-term labor, first trimester: Secondary | ICD-10-CM

## 2016-05-01 DIAGNOSIS — O99341 Other mental disorders complicating pregnancy, first trimester: Secondary | ICD-10-CM

## 2016-05-01 DIAGNOSIS — O09219 Supervision of pregnancy with history of pre-term labor, unspecified trimester: Secondary | ICD-10-CM

## 2016-05-01 DIAGNOSIS — O0991 Supervision of high risk pregnancy, unspecified, first trimester: Secondary | ICD-10-CM

## 2016-05-01 DIAGNOSIS — Z3689 Encounter for other specified antenatal screening: Secondary | ICD-10-CM

## 2016-05-01 DIAGNOSIS — O99340 Other mental disorders complicating pregnancy, unspecified trimester: Secondary | ICD-10-CM

## 2016-05-01 MED ORDER — HYDROXYZINE PAMOATE 50 MG PO CAPS: 50.0000 mg | ORAL_CAPSULE | Freq: Three times a day (TID) | ORAL | 3 refills | Status: DC | PRN

## 2016-05-01 MED ORDER — BUSPIRONE HCL 10 MG PO TABS: 10.0000 mg | ORAL_TABLET | Freq: Three times a day (TID) | ORAL | 3 refills | Status: DC | PRN

## 2016-05-01 NOTE — Progress Notes (Signed)
   PRENATAL VISIT NOTE  Subjective:  Kerry [name] is a 30 y.o. G2P0101 at [redacted]w[redacted]d being seen today for ongoing prenatal care.  She is currently monitored for the following issues for this high-risk pregnancy and has Anxiety; Previous preterm delivery, antepartum; Supervision of high-risk pregnancy; History of postpartum depression, currently pregnant; and Anxiety during pregnancy, antepartum on her problem list.  Patient reports some spotting last week, none currently and increased anxiety. Was on some medication before pregnancy; medication was not "safe during pregnancy". Wants to try something else that is safe.  Contractions: Not present. Vag. Bleeding: Scant.  Movement: Present. Denies leaking of fluid.   The following portions of the patient's history were reviewed and updated as appropriate: allergies, current medications, past family history, past medical history, past social history, past surgical history and problem list. Problem list updated.  Objective:   Vitals:   05/01/16 1438  BP: 95/67  Pulse: (!) 112  Weight: 204 lb (92.5 kg)    Fetal Status: Fetal Heart Rate (bpm): 158   Movement: Present     General:  Alert, oriented and cooperative. Patient is in no acute distress.  Skin: Skin is warm and dry. No rash noted.   Cardiovascular: Normal heart rate noted  Respiratory: Normal respiratory effort, no problems with respiration noted  Abdomen: Soft, gravid, appropriate for gestational age. Pain/Pressure: Present     Pelvic:  Cervical exam deferred        Extremities: Normal range of motion.  Edema: Trace  Mental Status: Normal mood and affect. Normal behavior. Normal judgment and thought content.   Assessment and Plan:  Pregnancy: G2P0101 at [redacted]w[redacted]d  1. Anxiety during pregnancy, antepartum Told to try Vistaril first, then Buspar if needed. Call/come in for worsening symptoms. - hydrOXYzine (VISTARIL) 50 MG capsule; Take 1 capsule (50 mg total) by mouth 3 (three) times  daily as needed for anxiety.  Dispense: 30 capsule; Refill: 3 - busPIRone (BUSPAR) 10 MG tablet; Take 1 tablet (10 mg total) by mouth 3 (three) times daily as needed (anxiety).  Dispense: 30 tablet; Refill: 3  2. Previous preterm delivery, antepartum Declines 17P. Will get cervical length scan. - [place] MFM OB COMP + 14 WK; Future - [place] MFM OB Transvaginal; Future  3. Encounter for fetal anatomic survey - [place] MFM OB COMP + 14 WK; Future  4. Supervision of high risk pregnancy in first trimester - Prenatal Profile Routine obstetric precautions reviewed. Please refer to After Visit Summary for other counseling recommendations.  Return in about 4 weeks (around 05/29/2016) for OB Visit.  Osborne Oman, MD

## 2016-05-01 NOTE — Patient Instructions (Signed)
Return to clinic for any scheduled appointments or obstetric concerns, or go to [facility] for evaluation  

## 2016-05-01 NOTE — Progress Notes (Signed)
Pt c/o increased anxiety over the past week, also states she is experiencing occasional spotting that is now brown tinged, last episode was on Saturday.

## 2016-05-02 LAB — PRENATAL PROFILE (SOLSTAS)
ANTIBODY SCREEN: NEGATIVE
BASOS ABS: 0 {cells}/uL (ref 0–200)
BASOS PCT: 0 %
EOS ABS: 92 {cells}/uL (ref 15–500)
Eosinophils Relative: 1 %
HCT: 36.6 % (ref 35.0–45.0)
HEP B S AG: NEGATIVE
HIV 1&2 Ab, 4th Generation: NONREACTIVE
Hemoglobin: 12.3 g/dL (ref 11.7–15.5)
LYMPHS ABS: 1564 {cells}/uL (ref 850–3900)
Lymphocytes Relative: 17 %
MCH: 31.6 pg (ref 27.0–33.0)
MCHC: 33.6 g/dL (ref 32.0–36.0)
MCV: 94.1 fL (ref 80.0–100.0)
MONO ABS: 736 {cells}/uL (ref 200–950)
MPV: 8.6 fL (ref 7.5–12.5)
Monocytes Relative: 8 %
NEUTROS ABS: 6808 {cells}/uL (ref 1500–7800)
Neutrophils Relative %: 74 %
PLATELETS: 272 10*3/uL (ref 140–400)
RBC: 3.89 MIL/uL (ref 3.80–5.10)
RDW: 13.1 % (ref 11.0–15.0)
RUBELLA: 6.19 {index} — AB (ref <0.90)
Rh Type: POSITIVE
WBC: 9.2 10*3/uL (ref 3.8–10.8)

## 2016-05-08 ENCOUNTER — Telehealth: Payer: Self-pay | Admitting: *Deleted

## 2016-05-08 NOTE — Telephone Encounter (Signed)
Spoke to pt, is going to come by the office this week to sign the [facility] application and medication will be shipped directly to pt so she can do the weekly injections.

## 2016-05-08 NOTE — Telephone Encounter (Signed)
-----   Message from Leonard sent at 05/05/2016 11:53 AM EDT ----- Regarding: !7p Injection Pt wants to get 17p injections now, however does not want to drive here every week. Wants to got [facility] to give injections only. Rasheeda from [facility] has a signed consent, can fax it to you if needed.

## 2016-05-10 ENCOUNTER — Telehealth: Payer: Self-pay | Admitting: *Deleted

## 2016-05-10 NOTE — Telephone Encounter (Signed)
-----   Message from Francia Greaves sent at 05/10/2016  9:47 AM EDT ----- Regarding: Pregnancy [place] from [facility] called [phone]) Needs something showing that patient has decided to receive 17p (last records show a decline), need something like a md order or the paper work that the patient to fill out and/or approve letter from [facility] (fax to them)    (Fax) [phone]

## 2016-05-10 NOTE — Telephone Encounter (Signed)
Faxed Makena Prescription order form, order will need to be placed at next visit when she has an encounter activated.

## 2016-05-17 ENCOUNTER — Encounter: Payer: Self-pay | Admitting: *Deleted

## 2016-05-17 ENCOUNTER — Encounter: Payer: Self-pay | Admitting: Physician Assistant

## 2016-05-17 ENCOUNTER — Ambulatory Visit: Payer: Self-pay | Admitting: Physician Assistant

## 2016-05-17 VITALS — BP 120/60 | HR 103 | Temp 98.4°F

## 2016-05-17 DIAGNOSIS — B372 Candidiasis of skin and nail: Secondary | ICD-10-CM

## 2016-05-17 DIAGNOSIS — R300 Dysuria: Secondary | ICD-10-CM

## 2016-05-17 LAB — POCT URINALYSIS DIPSTICK
Bilirubin, UA: NEGATIVE
Glucose, UA: NEGATIVE
Ketones, UA: NEGATIVE
Leukocytes, UA: NEGATIVE
Nitrite, UA: NEGATIVE
PH UA: 6.5
PROTEIN UA: NEGATIVE
RBC UA: NEGATIVE
SPEC GRAV UA: 1.02
UROBILINOGEN UA: 0.2

## 2016-05-17 MED ORDER — NYSTATIN 100000 UNIT/GM EX CREA: 1.0000 "application " | TOPICAL_CREAM | Freq: Two times a day (BID) | CUTANEOUS | 0 refills | Status: DC

## 2016-05-17 NOTE — Progress Notes (Signed)
S: states she has pressure with urination and stream seems small, no vaginal discharge, cramping or bleeding, also rash at edge of panty line, ?if yeast  O: vitals wnl, nad, skin with some pinkish irritation at inguinal folds, no drainage or odor, n/v intact, ua wnl  A: yeast  P: topical nystatin, otc cornstarch baby powder, f/u with gyn for pressure, if worsening and has any sx of labor go to ob or gyn

## 2016-05-29 ENCOUNTER — Ambulatory Visit (INDEPENDENT_AMBULATORY_CARE_PROVIDER_SITE_OTHER): Payer: Managed Care, Other (non HMO) | Admitting: Obstetrics & Gynecology

## 2016-05-29 VITALS — BP 97/65 | HR 97 | Wt 210.0 lb

## 2016-05-29 DIAGNOSIS — F419 Anxiety disorder, unspecified: Secondary | ICD-10-CM

## 2016-05-29 DIAGNOSIS — O09213 Supervision of pregnancy with history of pre-term labor, third trimester: Secondary | ICD-10-CM

## 2016-05-29 DIAGNOSIS — O0990 Supervision of high risk pregnancy, unspecified, unspecified trimester: Secondary | ICD-10-CM

## 2016-05-29 DIAGNOSIS — O09219 Supervision of pregnancy with history of pre-term labor, unspecified trimester: Secondary | ICD-10-CM

## 2016-05-29 DIAGNOSIS — O99340 Other mental disorders complicating pregnancy, unspecified trimester: Secondary | ICD-10-CM

## 2016-05-29 DIAGNOSIS — O99343 Other mental disorders complicating pregnancy, third trimester: Secondary | ICD-10-CM

## 2016-05-29 NOTE — Patient Instructions (Signed)
Return to clinic for any scheduled appointments or obstetric concerns, or go to [facility] for evaluation  

## 2016-05-29 NOTE — Progress Notes (Signed)
   PRENATAL VISIT NOTE  Subjective:  Kerry [name] is a 30 y.o. G2P0101 at [redacted]w[redacted]d being seen today for ongoing prenatal care.  She is currently monitored for the following issues for this high-risk pregnancy and has Anxiety; Previous preterm delivery, antepartum; Supervision of high-risk pregnancy; History of postpartum depression, currently pregnant; and Anxiety during pregnancy, antepartum on her problem list.  Patient reports no complaints.  Contractions: Not present. Vag. Bleeding: None.  Movement: Present. Denies leaking of fluid.   The following portions of the patient's history were reviewed and updated as appropriate: allergies, current medications, past family history, past medical history, past social history, past surgical history and problem list. Problem list updated.  Objective:   Vitals:   05/29/16 1551  BP: 97/65  Pulse: 97  Weight: 210 lb (95.3 kg)    Fetal Status: Fetal Heart Rate (bpm): 158   Movement: Present     General:  Alert, oriented and cooperative. Patient is in no acute distress.  Skin: Skin is warm and dry. No rash noted.   Cardiovascular: Normal heart rate noted  Respiratory: Normal respiratory effort, no problems with respiration noted  Abdomen: Soft, gravid, appropriate for gestational age. Pain/Pressure: Absent     Pelvic:  Cervical exam deferred        Extremities: Normal range of motion.  Edema: Trace  Mental Status: Normal mood and affect. Normal behavior. Normal judgment and thought content.   Assessment and Plan:  Pregnancy: G2P0101 at [redacted]w[redacted]d  1. 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  Will continue until 36 6/7 weeks.  2. Anxiety during pregnancy, antepartum Controlled on Vistaril, not taking Buspar yet.  3. Supervision of high risk pregnancy, antepartum Declined quad screen today. Anatomy scan scheduled on 06/08/16.  [facility] papers signed  today. No other complaints or concerns.  Routine obstetric precautions reviewed. Please refer to After Visit Summary for other counseling recommendations.  Return in about 4 weeks (around 06/26/2016) for OB Visit.   Osborne Oman, MD

## 2016-05-30 ENCOUNTER — Telehealth: Payer: Self-pay | Admitting: Obstetrics & Gynecology

## 2016-05-30 NOTE — Telephone Encounter (Signed)
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  Will continue until 36 6/7 weeks. Best way to contact is either calling and choosing the nurse line option or faxing info to [phone]

## 2016-05-30 NOTE — Telephone Encounter (Signed)
Kerry [name] with [employer] just wanted to verify that patient was receiving Makena shots in the office and wanted to know the best method to contact you guys regarding care, fax or a phone call. Please advise.

## 2016-05-31 ENCOUNTER — Telehealth: Payer: Self-pay | Admitting: *Deleted

## 2016-05-31 DIAGNOSIS — N760 Acute vaginitis: Principal | ICD-10-CM

## 2016-05-31 DIAGNOSIS — B9689 Other specified bacterial agents as the cause of diseases classified elsewhere: Secondary | ICD-10-CM

## 2016-05-31 MED ORDER — METRONIDAZOLE 500 MG PO TABS: 500.0000 mg | ORAL_TABLET | Freq: Two times a day (BID) | ORAL | 0 refills | Status: DC

## 2016-05-31 NOTE — Telephone Encounter (Signed)
Pt called c/o vaginal discharge with a foul smelling odor, denies any itching or irritation.  Sent Flagyl to pharmacy and instructed pt on medication use.

## 2016-06-06 ENCOUNTER — Telehealth: Payer: Self-pay | Admitting: Obstetrics & Gynecology

## 2016-06-06 NOTE — Telephone Encounter (Signed)
Kerry [name], with the [facility] called and stated they are almost out of her Mikena injection. They have one dose left. Please advise

## 2016-06-08 ENCOUNTER — Other Ambulatory Visit: Payer: Self-pay | Admitting: Obstetrics & Gynecology

## 2016-06-08 ENCOUNTER — Ambulatory Visit: Payer: Self-pay | Admitting: Physician Assistant

## 2016-06-08 ENCOUNTER — Encounter: Payer: Self-pay | Admitting: Physician Assistant

## 2016-06-08 ENCOUNTER — Ambulatory Visit (HOSPITAL_COMMUNITY)
Admission: RE | Admit: 2016-06-08 | Discharge: 2016-06-08 | Disposition: A | Discharge status: Home / Self Care | Payer: Managed Care, Other (non HMO) | Source: Ambulatory Visit | Attending: Obstetrics & Gynecology | Admitting: Obstetrics & Gynecology

## 2016-06-08 VITALS — BP 110/60 | HR 116 | Temp 98.6°F

## 2016-06-08 DIAGNOSIS — Z3689 Encounter for other specified antenatal screening: Secondary | ICD-10-CM | POA: Diagnosis present

## 2016-06-08 DIAGNOSIS — O09219 Supervision of pregnancy with history of pre-term labor, unspecified trimester: Secondary | ICD-10-CM

## 2016-06-08 DIAGNOSIS — J069 Acute upper respiratory infection, unspecified: Secondary | ICD-10-CM

## 2016-06-08 DIAGNOSIS — O09212 Supervision of pregnancy with history of pre-term labor, second trimester: Secondary | ICD-10-CM | POA: Insufficient documentation

## 2016-06-08 DIAGNOSIS — O4442 Low lying placenta NOS or without hemorrhage, second trimester: Secondary | ICD-10-CM | POA: Insufficient documentation

## 2016-06-08 DIAGNOSIS — Z3A19 19 weeks gestation of pregnancy: Secondary | ICD-10-CM

## 2016-06-08 MED ORDER — AZITHROMYCIN 250 MG PO TABS: ORAL_TABLET | ORAL | 0 refills | Status: DC

## 2016-06-08 NOTE — Progress Notes (Signed)
S: C/o runny nose and congestion for 5-6 days, congested cough, no fever, chills, cp/sob, v/d; mucus was green this am ;  cough is sporadic, hacking; pt is 19 weeks preg  Using otc meds: sudafed, mucinex  O: PE: vitals wnl, nad,  perrl eomi, normocephalic, tms dull, nasal mucosa red and swollen, throat injected, neck supple no lymph, lungs c t a, cv rrr, neuro intact, cough is wet  A:  Acute uri   P: drink fluids, continue regular meds , use otc meds of choice, return if not improving in 5 days, return earlier if worsening , zpack

## 2016-06-09 ENCOUNTER — Encounter: Payer: Self-pay | Admitting: Obstetrics & Gynecology

## 2016-06-09 DIAGNOSIS — O4442 Low lying placenta NOS or without hemorrhage, second trimester: Secondary | ICD-10-CM | POA: Insufficient documentation

## 2016-06-09 DIAGNOSIS — O26872 Cervical shortening, second trimester: Secondary | ICD-10-CM | POA: Insufficient documentation

## 2016-06-12 ENCOUNTER — Other Ambulatory Visit (HOSPITAL_COMMUNITY): Payer: Self-pay | Admitting: *Deleted

## 2016-06-12 DIAGNOSIS — O26879 Cervical shortening, unspecified trimester: Secondary | ICD-10-CM

## 2016-06-13 ENCOUNTER — Other Ambulatory Visit (HOSPITAL_COMMUNITY): Payer: Self-pay | Admitting: Maternal and Fetal Medicine

## 2016-06-13 ENCOUNTER — Ambulatory Visit (HOSPITAL_COMMUNITY)
Admission: RE | Admit: 2016-06-13 | Discharge: 2016-06-13 | Disposition: A | Discharge status: Home / Self Care | Payer: Managed Care, Other (non HMO) | Source: Ambulatory Visit | Attending: Family Medicine | Admitting: Family Medicine

## 2016-06-13 ENCOUNTER — Encounter (HOSPITAL_COMMUNITY): Payer: Self-pay

## 2016-06-13 DIAGNOSIS — O09212 Supervision of pregnancy with history of pre-term labor, second trimester: Secondary | ICD-10-CM | POA: Diagnosis present

## 2016-06-13 DIAGNOSIS — Z3A19 19 weeks gestation of pregnancy: Secondary | ICD-10-CM

## 2016-06-13 DIAGNOSIS — O26879 Cervical shortening, unspecified trimester: Secondary | ICD-10-CM

## 2016-06-13 DIAGNOSIS — O09892 Supervision of other high risk pregnancies, second trimester: Secondary | ICD-10-CM

## 2016-06-13 DIAGNOSIS — O4442 Low lying placenta NOS or without hemorrhage, second trimester: Secondary | ICD-10-CM

## 2016-06-13 DIAGNOSIS — F419 Anxiety disorder, unspecified: Secondary | ICD-10-CM

## 2016-06-13 DIAGNOSIS — O99340 Other mental disorders complicating pregnancy, unspecified trimester: Secondary | ICD-10-CM

## 2016-06-13 DIAGNOSIS — O26872 Cervical shortening, second trimester: Secondary | ICD-10-CM

## 2016-06-14 ENCOUNTER — Other Ambulatory Visit (HOSPITAL_COMMUNITY): Payer: Self-pay | Admitting: *Deleted

## 2016-06-14 DIAGNOSIS — O26872 Cervical shortening, second trimester: Secondary | ICD-10-CM

## 2016-06-20 ENCOUNTER — Telehealth: Payer: Self-pay | Admitting: *Deleted

## 2016-06-20 NOTE — Telephone Encounter (Signed)
-----   Message from Francia Greaves sent at 06/20/2016  9:39 AM EST ----- Regarding: Advise Contact: [phone] Wants to know what to do about her 17p injections, w/ the christmas closing, states she recieves in injections on Monday at [place]

## 2016-06-20 NOTE — Telephone Encounter (Signed)
Called pt back - LM - advised pt to wait until Tuesday when office reopens to receive next 17P inj.

## 2016-06-21 ENCOUNTER — Telehealth: Payer: Self-pay | Admitting: *Deleted

## 2016-06-21 DIAGNOSIS — B9689 Other specified bacterial agents as the cause of diseases classified elsewhere: Secondary | ICD-10-CM

## 2016-06-21 DIAGNOSIS — N760 Acute vaginitis: Principal | ICD-10-CM

## 2016-06-21 MED ORDER — METRONIDAZOLE 500 MG PO TABS: 500.0000 mg | ORAL_TABLET | Freq: Two times a day (BID) | ORAL | 0 refills | Status: DC

## 2016-06-21 NOTE — Telephone Encounter (Signed)
Pt called and states she is still experiencing a vaginal odor that was persistent with BV and was treated a few weeks ago.  Informed pt that BV may sometimes reoccur and we could treat her again for BV.  Medication sent to pharmacy and instructed pt on medication use.

## 2016-06-21 NOTE — Telephone Encounter (Signed)
-----   Message from Francia Greaves sent at 06/21/2016  8:39 AM EST ----- Regarding: Rx Request Thinks she has BV again

## 2016-06-27 ENCOUNTER — Ambulatory Visit (INDEPENDENT_AMBULATORY_CARE_PROVIDER_SITE_OTHER): Payer: Managed Care, Other (non HMO) | Admitting: Obstetrics & Gynecology

## 2016-06-27 ENCOUNTER — Encounter: Payer: Self-pay | Admitting: Obstetrics & Gynecology

## 2016-06-27 VITALS — BP 115/71 | HR 102 | Wt 211.0 lb

## 2016-06-27 DIAGNOSIS — O4442 Low lying placenta NOS or without hemorrhage, second trimester: Secondary | ICD-10-CM | POA: Diagnosis not present

## 2016-06-27 DIAGNOSIS — O26872 Cervical shortening, second trimester: Secondary | ICD-10-CM

## 2016-06-27 MED ORDER — FLUCONAZOLE 150 MG PO TABS: 150.0000 mg | ORAL_TABLET | Freq: Once | ORAL | 0 refills | Status: AC

## 2016-06-27 NOTE — Progress Notes (Signed)
[place] result and recommendations from 12/5 reviewed   PRENATAL VISIT NOTE  Subjective: was expecting top have cerclage scheduled per Dr Burnett Harry recommendation  Kerry [name] is a 30 y.o. G2P0101 at [redacted]w[redacted]d being seen today for ongoing prenatal care.  She is currently monitored for the following issues for this high-risk pregnancy and has Anxiety; Previous preterm delivery, antepartum; Supervision of high-risk pregnancy; History of postpartum depression, currently pregnant; Anxiety during pregnancy, antepartum; Short cervix during pregnancy in second trimester; and Low-lying posterior placenta in second trimester on her problem list.  Patient reports no complaints.  Contractions: Not present. Vag. Bleeding: None.  Movement: Present. Denies leaking of fluid.   The following portions of the patient's history were reviewed and updated as appropriate: allergies, current medications, past family history, past medical history, past social history, past surgical history and problem list. Problem list updated.  Objective:   Vitals:   06/27/16 1611  BP: 115/71  Pulse: (!) 102  Weight: 211 lb (95.7 kg)    Fetal Status: Fetal Heart Rate (bpm): 139   Movement: Present     General:  Alert, oriented and cooperative. Patient is in no acute distress.  Skin: Skin is warm and dry. No rash noted.   Cardiovascular: Normal heart rate noted  Respiratory: Normal respiratory effort, no problems with respiration noted  Abdomen: Soft, gravid, appropriate for gestational age. Pain/Pressure: Present     Pelvic:  Cervical exam deferred        Extremities: Normal range of motion.  Edema: Trace  Mental Status: Normal mood and affect. Normal behavior. Normal judgment and thought content.   Assessment and Plan:  Pregnancy: G2P0101 at [redacted]w[redacted]d  1. Short cervix during pregnancy in second trimester Yeast sx - fluconazole (DIFLUCAN) 150 MG tablet; Take 1 tablet (150 mg total) by mouth once.  Dispense: 1 tablet; Refill:  0  2. Low-lying posterior placenta in second trimester F/u US tomorrow for cx length, will schedule cerclage 12/21 with Dr Harolyn Rutherford  Preterm labor symptoms and general obstetric precautions including but not limited to vaginal bleeding, contractions, leaking of fluid and fetal movement were reviewed in detail with the patient. Please refer to After Visit Summary for other counseling recommendations.  Return in about 2 weeks (around 07/11/2016).   Woodroe Mode, MD

## 2016-06-28 ENCOUNTER — Other Ambulatory Visit (HOSPITAL_COMMUNITY): Payer: Self-pay | Admitting: Maternal and Fetal Medicine

## 2016-06-28 ENCOUNTER — Encounter (HOSPITAL_COMMUNITY): Payer: Self-pay

## 2016-06-28 ENCOUNTER — Ambulatory Visit (HOSPITAL_COMMUNITY)
Admission: RE | Admit: 2016-06-28 | Discharge: 2016-06-28 | Disposition: A | Discharge status: Home / Self Care | Payer: Managed Care, Other (non HMO) | Source: Ambulatory Visit | Attending: Obstetrics & Gynecology | Admitting: Obstetrics & Gynecology

## 2016-06-28 DIAGNOSIS — Z3A21 21 weeks gestation of pregnancy: Secondary | ICD-10-CM | POA: Diagnosis not present

## 2016-06-28 DIAGNOSIS — O26872 Cervical shortening, second trimester: Secondary | ICD-10-CM

## 2016-06-28 DIAGNOSIS — O4452 Low lying placenta with hemorrhage, second trimester: Secondary | ICD-10-CM

## 2016-06-28 DIAGNOSIS — O09212 Supervision of pregnancy with history of pre-term labor, second trimester: Secondary | ICD-10-CM | POA: Diagnosis not present

## 2016-06-28 DIAGNOSIS — O4442 Low lying placenta NOS or without hemorrhage, second trimester: Secondary | ICD-10-CM | POA: Insufficient documentation

## 2016-06-29 ENCOUNTER — Encounter (HOSPITAL_COMMUNITY): Payer: Self-pay

## 2016-06-30 ENCOUNTER — Ambulatory Visit (HOSPITAL_COMMUNITY): Payer: Managed Care, Other (non HMO) | Admitting: Anesthesiology

## 2016-06-30 ENCOUNTER — Ambulatory Visit (HOSPITAL_COMMUNITY)
Admission: RE | Admit: 2016-06-30 | Discharge: 2016-06-30 | Disposition: A | Discharge status: Other Acute Inpatient Hospital | Payer: Managed Care, Other (non HMO) | Source: Ambulatory Visit | Attending: Obstetrics and Gynecology | Admitting: Obstetrics and Gynecology

## 2016-06-30 ENCOUNTER — Encounter (HOSPITAL_COMMUNITY): Payer: Self-pay

## 2016-06-30 ENCOUNTER — Encounter (HOSPITAL_COMMUNITY)
Admission: RE | Disposition: A | Discharge status: Other Acute Inpatient Hospital | Payer: Self-pay | Source: Ambulatory Visit | Attending: Obstetrics and Gynecology

## 2016-06-30 DIAGNOSIS — O3432 Maternal care for cervical incompetence, second trimester: Secondary | ICD-10-CM | POA: Insufficient documentation

## 2016-06-30 DIAGNOSIS — O99212 Obesity complicating pregnancy, second trimester: Secondary | ICD-10-CM | POA: Insufficient documentation

## 2016-06-30 DIAGNOSIS — Z3A22 22 weeks gestation of pregnancy: Secondary | ICD-10-CM | POA: Insufficient documentation

## 2016-06-30 DIAGNOSIS — F419 Anxiety disorder, unspecified: Secondary | ICD-10-CM | POA: Diagnosis not present

## 2016-06-30 DIAGNOSIS — Z79899 Other long term (current) drug therapy: Secondary | ICD-10-CM | POA: Diagnosis not present

## 2016-06-30 DIAGNOSIS — O99342 Other mental disorders complicating pregnancy, second trimester: Secondary | ICD-10-CM | POA: Diagnosis not present

## 2016-06-30 HISTORY — PX: CERVICAL CERCLAGE: SHX1329

## 2016-06-30 LAB — CBC
HEMATOCRIT: 34.5 % — AB (ref 36.0–46.0)
HEMOGLOBIN: 11.8 g/dL — AB (ref 12.0–15.0)
MCH: 32.5 pg (ref 26.0–34.0)
MCHC: 34.2 g/dL (ref 30.0–36.0)
MCV: 95 fL (ref 78.0–100.0)
Platelets: 304 10*3/uL (ref 150–400)
RBC: 3.63 MIL/uL — AB (ref 3.87–5.11)
RDW: 12.9 % (ref 11.5–15.5)
WBC: 8.4 10*3/uL (ref 4.0–10.5)

## 2016-06-30 SURGERY — CERCLAGE, CERVIX, VAGINAL APPROACH
Anesthesia: Spinal | Site: Vagina

## 2016-06-30 MED ORDER — SCOPOLAMINE 1 MG/3DAYS TD PT72: 1.0000 | MEDICATED_PATCH | Freq: Once | TRANSDERMAL | Status: DC

## 2016-06-30 MED ORDER — MEPERIDINE HCL 25 MG/ML IJ SOLN: 6.2500 mg | INTRAMUSCULAR | Status: DC | PRN

## 2016-06-30 MED ORDER — BUPIVACAINE IN DEXTROSE 0.75-8.25 % IT SOLN
INTRATHECAL | Status: DC | PRN
  Administered 2016-06-30: 1.2 mL via INTRATHECAL

## 2016-06-30 MED ORDER — LIDOCAINE HCL 1 % IJ SOLN
INTRAMUSCULAR | Status: AC
  Filled 2016-06-30: qty 20

## 2016-06-30 MED ORDER — LACTATED RINGERS IV SOLN
INTRAVENOUS | Status: DC
  Administered 2016-06-30: 125 mL/h via INTRAVENOUS
  Administered 2016-06-30: 14:00:00 via INTRAVENOUS

## 2016-06-30 MED ORDER — FENTANYL CITRATE (PF) 100 MCG/2ML IJ SOLN
25.0000 ug | INTRAMUSCULAR | Status: DC | PRN
  Administered 2016-06-30: 50 ug via INTRAVENOUS

## 2016-06-30 MED ORDER — PROMETHAZINE HCL 25 MG/ML IJ SOLN: 6.2500 mg | INTRAMUSCULAR | Status: DC | PRN

## 2016-06-30 MED ORDER — MIDAZOLAM HCL 2 MG/2ML IJ SOLN: 0.5000 mg | Freq: Once | INTRAMUSCULAR | Status: DC | PRN

## 2016-06-30 MED ORDER — FENTANYL CITRATE (PF) 100 MCG/2ML IJ SOLN
INTRAMUSCULAR | Status: AC
  Filled 2016-06-30: qty 2

## 2016-06-30 MED ORDER — FENTANYL CITRATE (PF) 100 MCG/2ML IJ SOLN
INTRAMUSCULAR | Status: AC
  Administered 2016-06-30: 50 ug via INTRAVENOUS
  Filled 2016-06-30: qty 2

## 2016-06-30 MED ORDER — FENTANYL CITRATE (PF) 100 MCG/2ML IJ SOLN
INTRAMUSCULAR | Status: DC | PRN
  Administered 2016-06-30: 50 ug via INTRAVENOUS
  Administered 2016-06-30: 25 ug via INTRAVENOUS
  Administered 2016-06-30: 50 ug via INTRAVENOUS
  Administered 2016-06-30 (×3): 25 ug via INTRAVENOUS

## 2016-06-30 SURGICAL SUPPLY — 17 items
CANISTER SUCT 3000ML (MISCELLANEOUS) ×4 IMPLANT
COUNTER NEEDLE 1200 MAGNETIC (NEEDLE) ×2 IMPLANT
GLOVE BIOGEL PI IND STRL 6.5 (GLOVE) ×1 IMPLANT
GLOVE BIOGEL PI IND STRL 7.0 (GLOVE) ×1 IMPLANT
GLOVE BIOGEL PI INDICATOR 6.5 (GLOVE) ×1
GLOVE BIOGEL PI INDICATOR 7.0 (GLOVE) ×1
GLOVE SURG SS PI 6.5 STRL IVOR (GLOVE) ×2 IMPLANT
GOWN STRL REUS W/TWL LRG LVL3 (GOWN DISPOSABLE) ×4 IMPLANT
NS IRRIG 1000ML POUR BTL (IV SOLUTION) ×2 IMPLANT
PACK VAGINAL MINOR WOMEN LF (CUSTOM PROCEDURE TRAY) ×2 IMPLANT
PAD OB MATERNITY 4.3X12.25 (PERSONAL CARE ITEMS) ×2 IMPLANT
PAD PREP 24X48 CUFFED NSTRL (MISCELLANEOUS) ×2 IMPLANT
SUT PROLENE 0 CT 1 30 (SUTURE) ×2 IMPLANT
TOWEL OR 17X24 6PK STRL BLUE (TOWEL DISPOSABLE) ×4 IMPLANT
TRAY FOLEY CATH SILVER 14FR (SET/KITS/TRAYS/PACK) ×2 IMPLANT
TUBING NON-CON 1/4 X 20 CONN (TUBING) ×2 IMPLANT
YANKAUER SUCT BULB TIP NO VENT (SUCTIONS) ×2 IMPLANT

## 2016-06-30 NOTE — Anesthesia Preprocedure Evaluation (Addendum)
Anesthesia Evaluation  Patient identified by MRN, date of birth, ID band Patient awake    Reviewed: Allergy & Precautions, NPO status , Patient's Chart, lab work & pertinent test results  History of Anesthesia Complications Negative for: history of anesthetic complications  Airway Mallampati: I  TM Distance: >3 FB Neck ROM: Full    Dental  (+) Dental Advisory Given   Pulmonary neg pulmonary ROS,    breath sounds clear to auscultation       Cardiovascular negative cardio ROS   Rhythm:Regular Rate:Normal     Neuro/Psych negative neurological ROS     GI/Hepatic negative GI ROS, Neg liver ROS,   Endo/Other  Morbid obesity  Renal/GU negative Renal ROS     Musculoskeletal   Abdominal (+) + obese,   Peds  Hematology plt 304k   Anesthesia Other Findings   Reproductive/Obstetrics (+) Pregnancy                            Anesthesia Physical Anesthesia Plan  ASA: II  Anesthesia Plan: Spinal   Post-op Pain Management:    Induction:   Airway Management Planned: Natural Airway  Additional Equipment:   Intra-op Plan:   Post-operative Plan:   Informed Consent: I have reviewed the patients History and Physical, chart, labs and discussed the procedure including the risks, benefits and alternatives for the proposed anesthesia with the patient or authorized representative who has indicated his/her understanding and acceptance.   Dental advisory given  Plan Discussed with: CRNA and Surgeon  Anesthesia Plan Comments: (Plan routine monitors, SAB)        Anesthesia Quick Evaluation

## 2016-06-30 NOTE — Anesthesia Procedure Notes (Signed)
Spinal  Patient location during procedure: OR End time: 06/30/2016 1:36 PM Staffing Anesthesiologist: Annye Asa Performed: anesthesiologist  Preanesthetic Checklist Completed: patient identified, surgical consent, pre-op evaluation, timeout performed, IV checked, risks and benefits discussed and monitors and equipment checked Spinal Block Patient position: sitting Prep: site prepped and draped and DuraPrep Patient monitoring: blood pressure, continuous pulse ox and heart rate Approach: midline Location: L3-4 Injection technique: single-shot Needle Needle type: Quincke  Needle gauge: 25 G Additional Notes Pt identified in Operating room.  Monitors applied. Working IV access confirmed. Sterile prep, drape lumbar spine.  1% lido local L 3,4.  #25ga Quincke into clear CSF L 3,4.  9mg  0.75% Bupivacaine with dextrose injected with asp CSF beginning and end of injection.  Patient asymptomatic, VSS, no heme aspirated, tolerated well.  Jenita Seashore, MD

## 2016-06-30 NOTE — Transfer of Care (Signed)
Immediate Anesthesia Transfer of Care Note  Patient: Kerry [name]  Procedure(s) Performed: Procedure(s): Exam Under Anesthesia (N/A)  Patient Location: PACU  Anesthesia Type:Spinal  Level of Consciousness: awake, alert , oriented and patient cooperative  Airway & Oxygen Therapy: Patient Spontanous Breathing  Post-op Assessment: Report given to RN and Post -op Vital signs reviewed and stable  Post vital signs: Reviewed and stable  Last Vitals:  Vitals:   06/30/16 1128  BP: (!) 98/55  Pulse: 92  Resp: 16  Temp: 37 C    Last Pain:  Vitals:   06/30/16 1128  TempSrc: Oral      Patients Stated Pain Goal: 3 ([id number])  Complications: No apparent anesthesia complications

## 2016-06-30 NOTE — H&P (Signed)
Kerry [name] is a 30 y.o. female G2P1 at [redacted]w[redacted]d presenting for cerclage placement. Patient with prenatal care at [place] [facility] since 9 weeks complicated by history of preterm delivery currently on 17-P, low lying placenta, short cervix and anxiety. Patient is currently without complaints. She denies vaginal bleeding, leakage of fluid. She reports good fetal movement.  OB History    Gravida Para Term Preterm AB Living   2 1   1   1    SAB TAB Ectopic Multiple Live Births           1     Past Medical History:  Diagnosis Date  . Anxiety 04/26/2011  . Post partum depression    Past Surgical History:  Procedure Laterality Date  . right foot surgery  2014   Family History: family history includes Breast cancer (age of onset: 30) in her maternal aunt; Cancer (age of onset: 63) in her maternal aunt; Gout in her father; Hypertension in her father, maternal grandfather, maternal grandmother, and mother. Social History:  reports that she has never smoked. She has never used smokeless tobacco. She reports that she does not drink alcohol or use drugs.     Maternal Diabetes: No Genetic Screening: Declined Maternal Ultrasounds/Referrals: Normal Fetal Ultrasounds or other Referrals:  None Maternal Substance Abuse:  No Significant Maternal Medications:  None Significant Maternal Lab Results:  None Other Comments:  None  ROS  See pertinent in HPI History   Blood pressure (!) 98/55, pulse 92, temperature 98.6 F (37 C), temperature source Oral, resp. rate 16, last menstrual period 01/02/2016, SpO2 100 %. Exam Physical Exam  GENERAL: Well-developed, well-nourished female in no acute distress.  HEENT: Normocephalic, atraumatic. Sclerae anicteric.  NECK: Supple. Normal thyroid.  LUNGS: Clear to auscultation bilaterally.  HEART: Regular rate and rhythm. ABDOMEN: Soft, nontender, nondistended. PELVIC: Deferred to OR EXTREMITIES: No cyanosis, clubbing, or edema, 2+ distal pulses.  Prenatal  labs: ABO, Rh: O/POS/-- (10/23 1509) Antibody: NEG (10/23 1509) Rubella: 6.19 (10/23 1509) RPR: NON REAC (10/23 1509)  HBsAg: NEGATIVE (10/23 1509)  HIV: NONREACTIVE (10/23 1509)  GBS:      Assessment/Plan: 30 yo G2P1 at [place] with short cervix and history of preterm delivery here for rescue cerclage - Risks, benefits and alternatives were reviewed and explained to the patient, including but not limited to risks of bleeding, infection, rupture of membrane which may lead to preterm delivery and fetal loss. Patient and her husband verbalized understanding and all questions were answered.  Catheryne Deford 06/30/2016, 12:56 PM

## 2016-06-30 NOTE — Progress Notes (Signed)
[facility] arrived to transfer patient to [facility]. VSS, patient comfortable with only mild back discomfort rating at "2". Foley remains, IVF's infusing. Report given to [facility] staff and called to receiving L&D unit to Liam Rogers RN.

## 2016-06-30 NOTE — Op Note (Signed)
Kerry [name]   PROCEDURE DATE: 06/30/2016  PREOPERATIVE DIAGNOSIS: Intrauterine pregnancy at [redacted]w[redacted]d, with cervical incompetence   POSTOPERATIVE DIAGNOSIS: The same PROCEDURE: exam under anesthesia SURGEON:  Dr. Mora Bellman  INDICATIONS: 30 y.o. G2P0101 at [redacted]w[redacted]d with cervical incompetence, here for cerclage placement.   The risks of surgery were discussed in detail with the patient including but not limited to: bleeding; infection which may require antibiotic therapy; injury to cervix, vagina other surrounding organs; risk of ruptured membranes and/or preterm delivery and other postoperative or anesthesia complications.  Written informed consent was obtained.    FINDINGS: Cervical os dilated approximately 1.5- 2 cm with bulging bag visible at the external os.  ANESTHESIA:  Spinal INTRAVENOUS FLUIDS: 1000  ml ESTIMATED BLOOD LOSS: none ml COMPLICATIONS: None immediate  PROCEDURE IN DETAIL:  The patient received intravenous antibiotics and had sequential compression devices applied to her lower extremities while in the preoperative area.  Reassuring fetal heart rate was also obtained using a doppler. She was then taken to the operating room where spinal anesthesia was administered and was found to be adequate.  She was placed in the dorsal lithotomy, and was prepped and draped in a sterile manner. Her bladder was catheterized.  After an adequate timeout was performed, a vaginal speculum was then placed in the patient's vagina with the above noted findings. The procedure was aborted and the patient was made aware. Reassuring fetal heart rate was also obtained using a doppler in the recovery area.

## 2016-06-30 NOTE — Anesthesia Postprocedure Evaluation (Signed)
Anesthesia Post Note  Patient: Kerry [name]  Procedure(s) Performed: Procedure(s) (LRB): Exam Under Anesthesia (N/A)  Patient location during evaluation: PACU Anesthesia Type: Spinal Level of consciousness: awake and alert, oriented and patient cooperative Pain management: pain level controlled Vital Signs Assessment: post-procedure vital signs reviewed and stable Respiratory status: spontaneous breathing, nonlabored ventilation and respiratory function stable Cardiovascular status: blood pressure returned to baseline and stable Postop Assessment: spinal receding, patient able to bend at knees and no signs of nausea or vomiting Anesthetic complications: no        Last Vitals:  Vitals:   06/30/16 1430 06/30/16 1445  BP: 106/62 97/64  Pulse: 86 83  Resp: 18 (!) 21  Temp:      Last Pain:  Vitals:   06/30/16 1128  TempSrc: Oral   Pain Goal: Patients Stated Pain Goal: 3 (06/30/16 1128)               Talisha Erby,E. Gaylon Melchor

## 2016-06-30 NOTE — Progress Notes (Signed)
Patient ID: Kerry [name], female   DOB: Feb 03, 1986, 30 y.o.   MRN: [id number] Case was discussed with Dr. Carolyne Littles (MFM attending at [facility]). He also does not feel comfortable placing a cerclage in this patient who is at a peri viable. He offered transfer of care for steroid administration and neonatal intervention if delivery was to happen.  This was offered to the patient who agreed with transfer of care.

## 2016-07-04 ENCOUNTER — Encounter (HOSPITAL_COMMUNITY): Payer: Self-pay | Admitting: Obstetrics and Gynecology

## 2016-07-06 ENCOUNTER — Ambulatory Visit (HOSPITAL_COMMUNITY)
Admission: RE | Admit: 2016-07-06 | Discharge: 2016-07-06 | Disposition: A | Discharge status: Home / Self Care | Payer: Managed Care, Other (non HMO) | Source: Ambulatory Visit | Attending: Family Medicine | Admitting: Family Medicine

## 2016-07-06 ENCOUNTER — Other Ambulatory Visit (HOSPITAL_COMMUNITY): Payer: Self-pay | Admitting: Maternal and Fetal Medicine

## 2016-07-06 ENCOUNTER — Encounter (HOSPITAL_COMMUNITY): Payer: Self-pay

## 2016-07-06 DIAGNOSIS — O09212 Supervision of pregnancy with history of pre-term labor, second trimester: Secondary | ICD-10-CM | POA: Insufficient documentation

## 2016-07-06 DIAGNOSIS — Z3A23 23 weeks gestation of pregnancy: Secondary | ICD-10-CM | POA: Insufficient documentation

## 2016-07-06 DIAGNOSIS — Z3686 Encounter for antenatal screening for cervical length: Secondary | ICD-10-CM | POA: Insufficient documentation

## 2016-07-06 DIAGNOSIS — O26879 Cervical shortening, unspecified trimester: Secondary | ICD-10-CM

## 2016-07-06 DIAGNOSIS — Z362 Encounter for other antenatal screening follow-up: Secondary | ICD-10-CM | POA: Insufficient documentation

## 2016-07-06 DIAGNOSIS — O4442 Low lying placenta NOS or without hemorrhage, second trimester: Secondary | ICD-10-CM | POA: Diagnosis not present

## 2016-07-06 DIAGNOSIS — O26872 Cervical shortening, second trimester: Secondary | ICD-10-CM | POA: Insufficient documentation

## 2016-07-11 ENCOUNTER — Ambulatory Visit (HOSPITAL_COMMUNITY)
Admission: RE | Admit: 2016-07-11 | Discharge: 2016-07-11 | Disposition: A | Discharge status: Home / Self Care | Payer: Managed Care, Other (non HMO) | Source: Ambulatory Visit | Attending: Family Medicine | Admitting: Family Medicine

## 2016-07-11 DIAGNOSIS — Z3A23 23 weeks gestation of pregnancy: Secondary | ICD-10-CM | POA: Insufficient documentation

## 2016-07-11 DIAGNOSIS — O4442 Low lying placenta NOS or without hemorrhage, second trimester: Secondary | ICD-10-CM | POA: Diagnosis not present

## 2016-07-11 DIAGNOSIS — O26872 Cervical shortening, second trimester: Secondary | ICD-10-CM | POA: Insufficient documentation

## 2016-07-11 DIAGNOSIS — O09212 Supervision of pregnancy with history of pre-term labor, second trimester: Secondary | ICD-10-CM | POA: Diagnosis not present

## 2016-07-11 MED ORDER — BETAMETHASONE SOD PHOS & ACET 6 (3-3) MG/ML IJ SUSP
12.0000 mg | Freq: Once | INTRAMUSCULAR | Status: AC
  Administered 2016-07-11: 12 mg via INTRAMUSCULAR
  Filled 2016-07-11: qty 2

## 2016-07-12 ENCOUNTER — Ambulatory Visit (HOSPITAL_COMMUNITY): Payer: Managed Care, Other (non HMO)

## 2016-07-12 ENCOUNTER — Ambulatory Visit (HOSPITAL_COMMUNITY)
Admission: RE | Admit: 2016-07-12 | Discharge: 2016-07-12 | Disposition: A | Discharge status: Home / Self Care | Payer: Managed Care, Other (non HMO) | Source: Ambulatory Visit | Attending: Family Medicine | Admitting: Family Medicine

## 2016-07-12 DIAGNOSIS — Z3A00 Weeks of gestation of pregnancy not specified: Secondary | ICD-10-CM | POA: Diagnosis not present

## 2016-07-12 DIAGNOSIS — O26879 Cervical shortening, unspecified trimester: Secondary | ICD-10-CM | POA: Insufficient documentation

## 2016-07-12 MED ORDER — BETAMETHASONE SOD PHOS & ACET 6 (3-3) MG/ML IJ SUSP
12.0000 mg | Freq: Once | INTRAMUSCULAR | Status: AC
  Administered 2016-07-12: 12 mg via INTRAMUSCULAR
  Filled 2016-07-12: qty 2

## 2016-07-13 ENCOUNTER — Other Ambulatory Visit (HOSPITAL_COMMUNITY): Payer: Self-pay | Admitting: *Deleted

## 2016-07-13 DIAGNOSIS — O3432 Maternal care for cervical incompetence, second trimester: Secondary | ICD-10-CM

## 2016-07-21 DIAGNOSIS — Z98891 History of uterine scar from previous surgery: Secondary | ICD-10-CM | POA: Insufficient documentation

## 2016-07-24 ENCOUNTER — Encounter: Payer: Self-pay | Admitting: *Deleted

## 2016-07-25 ENCOUNTER — Encounter: Payer: Self-pay | Admitting: Family Medicine

## 2016-07-26 ENCOUNTER — Ambulatory Visit (HOSPITAL_COMMUNITY): Payer: Managed Care, Other (non HMO)

## 2016-07-26 ENCOUNTER — Encounter (HOSPITAL_COMMUNITY): Payer: Self-pay

## 2016-07-27 ENCOUNTER — Encounter: Payer: Self-pay | Admitting: *Deleted

## 2016-08-07 ENCOUNTER — Encounter: Payer: Self-pay | Admitting: *Deleted

## 2016-08-14 ENCOUNTER — Encounter: Payer: Self-pay | Admitting: Family Medicine

## 2016-08-14 ENCOUNTER — Ambulatory Visit (INDEPENDENT_AMBULATORY_CARE_PROVIDER_SITE_OTHER): Payer: Managed Care, Other (non HMO) | Admitting: Family Medicine

## 2016-08-14 DIAGNOSIS — F419 Anxiety disorder, unspecified: Secondary | ICD-10-CM

## 2016-08-14 NOTE — Progress Notes (Signed)
Post Partum Exam  Kerry [name] is a 31 y.o. G54P0101 female who presents for a postpartum visit. She is 4 weeks postpartum following a fundal vertical Cesarean section. I have fully reviewed the prenatal and intrapartum course. The delivery was at [redacted]w[redacted]d gestational weeks.  Anesthesia: epidural. Postpartum course has been positive for tenderness at surgical site. Baby is in NICU at [place] is feeding by breast. Bleeding changing a thin pad 2 times a day. Bowel function is normal. Bladder function is normal. Patient is sexually active. Contraception method is Started Associate Professor on 08/06/16. Postpartum depression screening:Positive. Score=11  The following portions of the patient's history were reviewed and updated as appropriate: allergies, current medications, past family history, past medical history, past social history, past surgical history and problem list.  Review of Systems Pertinent items noted in HPI and remainder of comprehensive ROS otherwise negative.    Objective:    BP 116/78 mmHg  Pulse 78  Resp 16  Ht 5\' 5"  (1.651 m)  Wt 211 lb (95.709 kg)  BMI 35.11 kg/m2  Breastfeeding? Yes  General:  alert, cooperative and appears stated age  Lungs: normal effort  Heart:  regular rate and rhythm  Abdomen: soft, non-tender; bowel sounds normal; no masses,  no organomegaly Incision is well healed        Assessment:    Postpartum exam. Pap smear not done at today's visit.   Plan:   1. Contraception: oral progesterone-only contraceptive 2. Pap due 08/2018 3. Try mindfulness for anxiety until no longer breast feeding. Counseling can be added as needed 3. Follow up in: 1 year  or as needed.

## 2016-08-14 NOTE — Patient Instructions (Signed)
Generalized Anxiety Disorder Generalized anxiety disorder (GAD) is a mental disorder. It interferes with life functions, including relationships, work, and school. GAD is different from normal anxiety, which everyone experiences at some point in their lives in response to specific life events and activities. Normal anxiety actually helps us prepare for and get through these life events and activities. Normal anxiety goes away after the event or activity is over.  GAD causes anxiety that is not necessarily related to specific events or activities. It also causes excess anxiety in proportion to specific events or activities. The anxiety associated with GAD is also difficult to control. GAD can vary from mild to severe. People with severe GAD can have intense waves of anxiety with physical symptoms (panic attacks).  SYMPTOMS The anxiety and worry associated with GAD are difficult to control. This anxiety and worry are related to many life events and activities and also occur more days than not for 6 months or longer. People with GAD also have three or more of the following symptoms (one or more in children):  Restlessness.   Fatigue.  Difficulty concentrating.   Irritability.  Muscle tension.  Difficulty sleeping or unsatisfying sleep. DIAGNOSIS GAD is diagnosed through an assessment by your health care provider. Your health care provider will ask you questions aboutyour mood,physical symptoms, and events in your life. Your health care provider may ask you about your medical history and use of alcohol or drugs, including prescription medicines. Your health care provider may also do a physical exam and blood tests. Certain medical conditions and the use of certain substances can cause symptoms similar to those associated with GAD. Your health care provider may refer you to a mental health specialist for further evaluation. TREATMENT The following therapies are usually used to treat GAD:    Medication. Antidepressant medication usually is prescribed for long-term daily control. Antianxiety medicines may be added in severe cases, especially when panic attacks occur.   Talk therapy (psychotherapy). Certain types of talk therapy can be helpful in treating GAD by providing support, education, and guidance. A form of talk therapy called cognitive behavioral therapy can teach you healthy ways to think about and react to daily life events and activities.  Stress managementtechniques. These include yoga, meditation, and exercise and can be very helpful when they are practiced regularly. A mental health specialist can help determine which treatment is best for you. Some people see improvement with one therapy. However, other people require a combination of therapies. This information is not intended to replace advice given to you by your health care provider. Make sure you discuss any questions you have with your health care provider. Document Released: 10/21/2012 Document Revised: 07/17/2014 Document Reviewed: 10/21/2012 Elsevier Interactive Patient Education  2017 [employer] Inc.  

## 2016-08-16 ENCOUNTER — Encounter: Payer: Self-pay | Admitting: Radiology

## 2017-03-28 ENCOUNTER — Ambulatory Visit: Payer: Self-pay | Admitting: Physician Assistant

## 2017-03-28 ENCOUNTER — Encounter: Payer: Self-pay | Admitting: Physician Assistant

## 2017-03-28 VITALS — BP 110/70 | HR 113 | Temp 98.5°F | Resp 16

## 2017-03-28 DIAGNOSIS — H6692 Otitis media, unspecified, left ear: Secondary | ICD-10-CM

## 2017-03-28 DIAGNOSIS — J0100 Acute maxillary sinusitis, unspecified: Secondary | ICD-10-CM

## 2017-03-28 MED ORDER — AMOXICILLIN 875 MG PO TABS: 875.0000 mg | ORAL_TABLET | Freq: Two times a day (BID) | ORAL | 0 refills | Status: DC

## 2017-03-28 MED ORDER — FLUCONAZOLE 150 MG PO TABS: ORAL_TABLET | ORAL | 0 refills | Status: DC

## 2017-03-28 NOTE — Progress Notes (Signed)
S: C/o runny nose and congestion for 3 days, ear pain on left side, no fever, chills, cp/sob, v/d; mucus is green and thick, cough is sporadic, c/o of facial and dental pain.   Using otc meds: none  O: PE: vitals wnl, nad, perrl eomi, normocephalic, tms dull, nasal mucosa red and swollen, throat injected, neck supple no lymph, lungs c t a, cv rrr, neuro intact  A:  Acute sinusitis   P: drink fluids, continue regular meds , use otc meds of choice, return if not improving in 5 days, return earlier if worsening , amoxil, diflucan

## 2017-06-06 ENCOUNTER — Ambulatory Visit: Payer: Self-pay | Admitting: Emergency Medicine

## 2017-06-06 VITALS — BP 145/72 | HR 96 | Temp 98.5°F | Resp 16

## 2017-06-06 DIAGNOSIS — J069 Acute upper respiratory infection, unspecified: Secondary | ICD-10-CM

## 2017-06-06 MED ORDER — PSEUDOEPH-BROMPHEN-DM 30-2-10 MG/5ML PO SYRP
5.0000 mL | ORAL_SOLUTION | Freq: Four times a day (QID) | ORAL | 0 refills | Status: DC | PRN
Start: 2017-06-06 — End: 2018-03-05

## 2017-06-06 NOTE — Progress Notes (Signed)
S:  Is here with  complaint of bilateral ear pain,  productive cough and drainage. She is unaware of fever or chills.   O:  TMS dull bilaterally, nose boggy, throat with drainage present.  Neck supple without aden.  Lungs clear bilat. A:  Viral URI P:  Bromfed DM qid prn

## 2017-08-24 DIAGNOSIS — E049 Nontoxic goiter, unspecified: Secondary | ICD-10-CM | POA: Insufficient documentation

## 2017-08-27 ENCOUNTER — Encounter: Payer: Self-pay | Admitting: Emergency Medicine

## 2017-08-27 ENCOUNTER — Ambulatory Visit: Payer: Self-pay | Admitting: Emergency Medicine

## 2017-08-27 VITALS — BP 110/78 | HR 91 | Temp 98.8°F

## 2017-08-27 DIAGNOSIS — J069 Acute upper respiratory infection, unspecified: Secondary | ICD-10-CM

## 2017-08-27 DIAGNOSIS — Z299 Encounter for prophylactic measures, unspecified: Secondary | ICD-10-CM

## 2017-08-27 LAB — POCT INFLUENZA A/B
Influenza A, POC: NEGATIVE
Influenza B, POC: NEGATIVE

## 2017-08-27 NOTE — Progress Notes (Signed)
Subjective: 2 days of myalgias. Might have had low-grade fever, but not currently. Minimal nonproductive cough. Ears feel stuffy. No definite sore throat heard no rash or tick bite. Has not tried any medication for this. Currently, denies any significant myalgias. No arthralgias. Slight fatigue. She may have had some chills yesterday, but that resolved. Today, denies any fever or chills.  Objective: Normal vital signs, reviewed. She appears well, no distress. HEENT negative, except minimal nasal congestion and stuffiness. TMs normal. Pharynx minimal injection without exudate. No facial tenderness. Neck supple, no adenopathy Lungs: Clear Heart regular rate and rhythm Skin without rash  Today, rapid strep test negative.  Assessment: Likely has viral URI  Plans: Symptomatic care discussed. Mucinex D Follow-up with your primary care doctor in 5-7 days if not improving, or sooner if symptoms become worse. Precautions discussed. Red flags discussed. Questions invited and answered. Patient voiced understanding and agreement.

## 2018-03-05 ENCOUNTER — Ambulatory Visit: Payer: Self-pay | Admitting: Family Medicine

## 2018-03-05 VITALS — BP 123/71 | HR 96 | Temp 98.5°F | Resp 20

## 2018-03-05 DIAGNOSIS — R350 Frequency of micturition: Secondary | ICD-10-CM

## 2018-03-05 DIAGNOSIS — R300 Dysuria: Secondary | ICD-10-CM

## 2018-03-05 LAB — POCT URINALYSIS DIPSTICK
Bilirubin, UA: NEGATIVE
Glucose, UA: NEGATIVE
Ketones, UA: NEGATIVE
Leukocytes, UA: NEGATIVE
NITRITE UA: NEGATIVE
PH UA: 5.5 (ref 5.0–8.0)
PROTEIN UA: NEGATIVE
Spec Grav, UA: 1.01 (ref 1.010–1.025)
UROBILINOGEN UA: 0.2 U/dL

## 2018-03-05 LAB — POCT URINE PREGNANCY: Preg Test, Ur: NEGATIVE

## 2018-03-05 NOTE — Progress Notes (Signed)
Subjective: "pressure with urination"    Kerry [name] is a 32 y.o. female who complains of intermittent urinary frequency for 10 days.  Patient also complains of "urethral pressure before and/or after urination every once in a while".  Patient also endorses a change and the consistency of bowel movements for the last week and a half.  Patient describes these as semi-formed but normal in color.  Patient believes this is due to drinking keto shakes and that this has been an intermittent problem for her in the past when drinking the shakes.  Denies any other changes in her diet other than she began drinking diet Pepsi during the last month.  Denies steatorrhea, foul stool, watery diarrhea, mucus, blood, or any other abnormal findings.  Patient denies diarrhea.  Patient denies flank pain, dysuria, urinary urgency, incontinence, suprapubic pressure, hematuria, odor to urine or vaginal discharge, nausea, vomiting, congestion, cough, fever, chills, malaise, headache, rhinitis, sorethroat, abdominal pain, changes in/abnormal vaginal discharge.  Patient does not have a history of recurrent UTI.  Patient does not have a history of pyelonephritis.  Denies abnormal vaginal bleeding or changes in her menstrual cycle.  Denies any history of STI or concern for STI. Treatment attempted at home: Monistat and Azo.  Review of Systems Pertinent items noted in HPI and remainder of comprehensive ROS otherwise negative.    Objective:   General: alert, cooperative, appears stated age and no distress  Abdomen:  Mild suprapubic pressure.  Otherwise soft, non-tender, without masses or organomegaly   Back: CVA tenderness absent  GU: defer exam - unable to perform in clinic    Laboratory:  Urine dipstick shows negative for all components except for trace-intact blood.   Urine pregnancy test negative.   Assessment:   Urinary frequency   Plan: Plan:   Urine culture pending.  Advised patient to follow-up with her  primary care provider this week for pelvic exam, trace hematuria, and STI testing.  Patient vague regarding STI risk factors, informed patient we are unable to do this testing here today. Advised patient to stop drinking keto shakes, which she believes are responsible for her changes in bowel movement consistency. Advised patient to avoid bladder irritants, discussed these. Maintain adequate hydration Follow up with primary care provider, as mentioned above.  Red flag symptoms and indications to seek medical care discussed.

## 2018-03-07 LAB — URINE CULTURE

## 2018-03-07 NOTE — Progress Notes (Signed)
Called and spoke with patient over the phone and informed her of her urine culture results.  Patient reports that she feels much better and her symptoms have resolved, denies any new symptoms.  Patient saw her primary care provider, who patient reports did STD testing and "started me on a medication for extra vaginal bacteria, I think it starts with an F".  Patient believes this is Flagyl when asked.

## 2018-07-11 ENCOUNTER — Ambulatory Visit: Payer: Self-pay | Admitting: Emergency Medicine

## 2018-07-11 VITALS — BP 110/70 | HR 108 | Temp 98.1°F | Resp 14

## 2018-07-11 DIAGNOSIS — R05 Cough: Secondary | ICD-10-CM

## 2018-07-11 DIAGNOSIS — J0100 Acute maxillary sinusitis, unspecified: Secondary | ICD-10-CM

## 2018-07-11 DIAGNOSIS — R059 Cough, unspecified: Secondary | ICD-10-CM

## 2018-07-11 DIAGNOSIS — J101 Influenza due to other identified influenza virus with other respiratory manifestations: Secondary | ICD-10-CM

## 2018-07-11 LAB — POCT INFLUENZA A/B
INFLUENZA A, POC: POSITIVE — AB
Influenza B, POC: NEGATIVE

## 2018-07-11 MED ORDER — FLUCONAZOLE 150 MG PO TABS: 150.0000 mg | ORAL_TABLET | Freq: Once | ORAL | 0 refills | Status: AC

## 2018-07-11 MED ORDER — AMOXICILLIN 875 MG PO TABS: 875.0000 mg | ORAL_TABLET | Freq: Two times a day (BID) | ORAL | 0 refills | Status: DC

## 2018-07-11 NOTE — Progress Notes (Signed)
Subjective: Patient had the onset on Saturday of a cough.  Following this she developed nasal congestion associated with a lot of sinus pressure.  She has had some purulent nasal drainage.  Over the last 2 days she has had fever.  She did not have a flu shot this year.  She has not had any productive cough but has had significant facial pain and pressure.  Her nose feels swollen to her. Review of systems: Last night she had an episode of pain in her left lower quadrant after coughing.  This has resolved.  She has no GI or GU symptoms at present. Objective: HEENT exam There is puffiness around the nasal area with tenderness over both maxillary sinuses. TMs are clear. Conjunctive are not red. Nose exam reveals significant redness of the turbinates with some bleeding after the flu test. Throat is red. Neck is supple without adenopathy. Chest clear to auscultation. Heart regular rate and rhythm. Flu test positive influenza A Assessment: Patient tested positive for influenza A.  She is outside of treatment for a flu related illness.  She appears to have a secondary sinusitis.  There is puffiness around the nasal area with tenderness over the ethmoid area bilaterally. Plan: Amoxicillin 875 twice daily. Diflucan for history of yeast with antibiotics. Birth control backup.

## 2018-07-11 NOTE — Patient Instructions (Addendum)
Take antibiotics as instructed. Use birth control backup. You have Diflucan in case you develop a yeast infection. Follow-up with your primary care provider if persistent symptoms.  Influenza, Adult Influenza is also called "the flu." It is an infection in the lungs, nose, and throat (respiratory tract). It is caused by a virus. The flu causes symptoms that are similar to symptoms of a cold. It also causes a high fever and body aches. The flu spreads easily from person to person (is contagious). Getting a flu shot (influenza vaccination) every year is the best way to prevent the flu. What are the causes? This condition is caused by the influenza virus. You can get the virus by:  Breathing in droplets that are in the air from the cough or sneeze of a person who has the virus.  Touching something that has the virus on it (is contaminated) and then touching your mouth, nose, or eyes. What increases the risk? Certain things may make you more likely to get the flu. These include:  Not washing your hands often.  Having close contact with many people during cold and flu season.  Touching your mouth, eyes, or nose without first washing your hands.  Not getting a flu shot every year. You may have a higher risk for the flu, along with serious problems such as a lung infection (pneumonia), if you:  Are older than 65.  Are pregnant.  Have a weakened disease-fighting system (immune system) because of a disease or taking certain medicines.  Have a long-term (chronic) illness, such as: ? Heart, kidney, or lung disease. ? Diabetes. ? Asthma.  Have a liver disorder.  Are very overweight (morbidly obese).  Have anemia. This is a condition that affects your red blood cells. What are the signs or symptoms? Symptoms usually begin suddenly and last 4-14 days. They may include:  Fever and chills.  Headaches, body aches, or muscle aches.  Sore throat.  Cough.  Runny or stuffy (congested)  nose.  Chest discomfort.  Not wanting to eat as much as normal (poor appetite).  Weakness or feeling tired (fatigue).  Dizziness.  Feeling sick to your stomach (nauseous) or throwing up (vomiting). How is this treated? If the flu is found early, you can be treated with medicine that can help reduce how bad the illness is and how long it lasts (antiviral medicine). This may be given by mouth (orally) or through an IV tube. Taking care of yourself at home can help your symptoms get better. Your doctor may suggest:  Taking over-the-counter medicines.  Drinking plenty of fluids. The flu often goes away on its own. If you have very bad symptoms or other problems, you may be treated in a hospital. Follow these instructions at home:     Activity  Rest as needed. Get plenty of sleep.  Stay home from work or school as told by your doctor. ? Do not leave home until you do not have a fever for 24 hours without taking medicine. ? Leave home only to visit your doctor. Eating and drinking  Take an ORS (oral rehydration solution). This is a drink that is sold at pharmacies and stores.  Drink enough fluid to keep your pee (urine) pale yellow.  Drink clear fluids in small amounts as you are able. Clear fluids include: ? Water. ? Ice chips. ? Fruit juice that has water added (diluted fruit juice). ? Low-calorie sports drinks.  Eat bland, easy-to-digest foods in small amounts as you are able.  These foods include: ? Bananas. ? Applesauce. ? Rice. ? Lean meats. ? Toast. ? Crackers.  Do not eat or drink: ? Fluids that have a lot of sugar or caffeine. ? Alcohol. ? Spicy or fatty foods. General instructions  Take over-the-counter and prescription medicines only as told by your doctor.  Use a cool mist humidifier to add moisture to the air in your home. This can make it easier for you to breathe.  Cover your mouth and nose when you cough or sneeze.  Wash your hands with soap and  water often, especially after you cough or sneeze. If you cannot use soap and water, use alcohol-based hand sanitizer.  Keep all follow-up visits as told by your doctor. This is important. How is this prevented?   Get a flu shot every year. You may get the flu shot in late summer, fall, or winter. Ask your doctor when you should get your flu shot.  Avoid contact with people who are sick during fall and winter (cold and flu season). Contact a doctor if:  You get new symptoms.  You have: ? Chest pain. ? Watery poop (diarrhea). ? A fever.  Your cough gets worse.  You start to have more mucus.  You feel sick to your stomach.  You throw up. Get help right away if you:  Have shortness of breath.  Have trouble breathing.  Have skin or nails that turn a bluish color.  Have very bad pain or stiffness in your neck.  Get a sudden headache.  Get sudden pain in your face or ear.  Cannot eat or drink without throwing up. Summary  Influenza ("the flu") is an infection in the lungs, nose, and throat. It is caused by a virus.  Take over-the-counter and prescription medicines only as told by your doctor.  Getting a flu shot every year is the best way to avoid getting the flu. This information is not intended to replace advice given to you by your health care provider. Make sure you discuss any questions you have with your health care provider. Document Released: 04/04/2008 Document Revised: 12/12/2017 Document Reviewed: 12/12/2017 Elsevier Interactive Patient Education  2019 [employer]    Sinusitis, Adult Sinusitis is soreness and swelling (inflammation) of your sinuses. Sinuses are hollow spaces in the bones around your face. They are located:  Around your eyes.  In the middle of your forehead.  Behind your nose.  In your cheekbones. Your sinuses and nasal passages are lined with a fluid called mucus. Mucus drains out of your sinuses. Swelling can trap mucus in your  sinuses. This lets germs (bacteria, virus, or fungus) grow, which leads to infection. Most of the time, this condition is caused by a virus. What are the causes? This condition is caused by:  Allergies.  Asthma.  Germs.  Things that block your nose or sinuses.  Growths in the nose (nasal polyps).  Chemicals or irritants in the air.  Fungus (rare). What increases the risk? You are more likely to develop this condition if:  You have a weak body defense system (immune system).  You do a lot of swimming or diving.  You use nasal sprays too much.  You smoke. What are the signs or symptoms? The main symptoms of this condition are pain and a feeling of pressure around the sinuses. Other symptoms include:  Stuffy nose (congestion).  Runny nose (drainage).  Swelling and warmth in the sinuses.  Headache.  Toothache.  A cough that may  get worse at night.  Mucus that collects in the throat or the back of the nose (postnasal drip).  Being unable to smell and taste.  Being very tired (fatigue).  A fever.  Sore throat.  Bad breath. How is this diagnosed? This condition is diagnosed based on:  Your symptoms.  Your medical history.  A physical exam.  Tests to find out if your condition is short-term (acute) or long-term (chronic). Your doctor may: ? Check your nose for growths (polyps). ? Check your sinuses using a tool that has a light (endoscope). ? Check for allergies or germs. ? Do imaging tests, such as an MRI or CT scan. How is this treated? Treatment for this condition depends on the cause and whether it is short-term or long-term.  If caused by a virus, your symptoms should go away on their own within 10 days. You may be given medicines to relieve symptoms. They include: ? Medicines that shrink swollen tissue in the nose. ? Medicines that treat allergies (antihistamines). ? A spray that treats swelling of the nostrils. ? Rinses that help get rid of  thick mucus in your nose (nasal saline washes).  If caused by bacteria, your doctor may wait to see if you will get better without treatment. You may be given antibiotic medicine if you have: ? A very bad infection. ? A weak body defense system.  If caused by growths in the nose, you may need to have surgery. Follow these instructions at home: Medicines  Take, use, or apply over-the-counter and prescription medicines only as told by your doctor. These may include nasal sprays.  If you were prescribed an antibiotic medicine, take it as told by your doctor. Do not stop taking the antibiotic even if you start to feel better. Hydrate and humidify   Drink enough water to keep your pee (urine) pale yellow.  Use a cool mist humidifier to keep the humidity level in your home above 50%.  Breathe in steam for 10-15 minutes, 3-4 times a day, or as told by your doctor. You can do this in the bathroom while a hot shower is running.  Try not to spend time in cool or dry air. Rest  Rest as much as you can.  Sleep with your head raised (elevated).  Make sure you get enough sleep each night. General instructions   Put a warm, moist washcloth on your face 3-4 times a day, or as often as told by your doctor. This will help with discomfort.  Wash your hands often with soap and water. If there is no soap and water, use hand sanitizer.  Do not smoke. Avoid being around people who are smoking (secondhand smoke).  Keep all follow-up visits as told by your doctor. This is important. Contact a doctor if:  You have a fever.  Your symptoms get worse.  Your symptoms do not get better within 10 days. Get help right away if:  You have a very bad headache.  You cannot stop throwing up (vomiting).  You have very bad pain or swelling around your face or eyes.  You have trouble seeing.  You feel confused.  Your neck is stiff.  You have trouble breathing. Summary  Sinusitis is swelling of  your sinuses. Sinuses are hollow spaces in the bones around your face.  This condition is caused by tissues in your nose that become inflamed or swollen. This traps germs. These can lead to infection.  If you were prescribed an antibiotic  medicine, take it as told by your doctor. Do not stop taking it even if you start to feel better.  Keep all follow-up visits as told by your doctor. This is important. This information is not intended to replace advice given to you by your health care provider. Make sure you discuss any questions you have with your health care provider. Document Released: 12/13/2007 Document Revised: 11/26/2017 Document Reviewed: 11/26/2017 Elsevier Interactive Patient Education  2019 [employer].

## 2018-09-16 ENCOUNTER — Telehealth: Payer: Self-pay

## 2018-09-16 NOTE — Telephone Encounter (Signed)
The patient called the clinic to see if she needed to schedulle an appointment with [place] to be referred out for the possible removal of her Hernia. She will try to get an appointment with her PCP but if she is unable to she will call back and schedule an appointment.

## 2018-12-10 ENCOUNTER — Telehealth: Payer: Managed Care, Other (non HMO) | Admitting: Adult Health

## 2018-12-10 ENCOUNTER — Other Ambulatory Visit: Payer: Self-pay

## 2018-12-10 ENCOUNTER — Encounter: Payer: Self-pay | Admitting: Adult Health

## 2018-12-10 DIAGNOSIS — B3731 Acute candidiasis of vulva and vagina: Secondary | ICD-10-CM | POA: Insufficient documentation

## 2018-12-10 DIAGNOSIS — B373 Candidiasis of vulva and vagina: Secondary | ICD-10-CM | POA: Diagnosis not present

## 2018-12-10 MED ORDER — FLUCONAZOLE 150 MG PO TABS: 150.0000 mg | ORAL_TABLET | Freq: Once | ORAL | 0 refills | Status: AC

## 2018-12-10 NOTE — Progress Notes (Deleted)
  Subjective:     Patient ID: Kerry [name], female   DOB: 1986/07/08, 33 y.o.   MRN: [id number]  HPI   Review of Systems     Objective:   Physical Exam     Assessment:     ***    Plan:     ***

## 2018-12-10 NOTE — Patient Instructions (Addendum)
Fluconazole tablets What is this medicine? FLUCONAZOLE (floo KON na zole) is an antifungal medicine. It is used to treat certain kinds of fungal or yeast infections. This medicine may be used for other purposes; ask your health care provider or pharmacist if you have questions. COMMON BRAND NAME(S): Diflucan What should I tell my health care provider before I take this medicine? They need to know if you have any of these conditions: -history of irregular heart beat -kidney disease -an unusual or allergic reaction to fluconazole, other azole antifungals, medicines, foods, dyes, or preservatives -pregnant or trying to get pregnant -breast-feeding How should I use this medicine? Take this medicine by mouth. Follow the directions on the prescription label. Do not take your medicine more often than directed. Talk to your pediatrician regarding the use of this medicine in children. Special care may be needed. This medicine has been used in children as young as 15 months of age. Overdosage: If you think you have taken too much of this medicine contact a poison control center or emergency room at once. NOTE: This medicine is only for you. Do not share this medicine with others. What if I miss a dose? If you miss a dose, take it as soon as you can. If it is almost time for your next dose, take only that dose. Do not take double or extra doses. What may interact with this medicine? Do not take this medicine with any of the following medications: -astemizole -certain medicines for irregular heart beat like dofetilide, dronedarone, quinidine -cisapride -erythromycin -lomitapide -other medicines that prolong the QT interval (cause an abnormal heart rhythm) -pimozide -terfenadine -thioridazine -tolvaptan -ziprasidone This medicine may also interact with the following medications: -antiviral medicines for HIV or AIDS -birth control pills -certain antibiotics like rifabutin, rifampin -certain  medicines for blood pressure like amlodipine, isradipine, felodipine, hydrochlorothiazide, losartan, nifedipine -certain medicines for cancer like cyclophosphamide, vinblastine, vincristine -certain medicines for cholesterol like atorvastatin, lovastatin, fluvastatin, simvastatin -certain medicines for depression, anxiety, or psychotic disturbances like amitriptyline, midazolam, nortriptyline, triazolam -certain medicines for diabetes like glipizide, glyburide, tolbutamide -certain medicines for pain like alfentanil, fentanyl, methadone -certain medicines for seizures like carbamazepine, phenytoin -certain medicines that treat or prevent blood clots like warfarin -halofantrine -medicines that lower your chance of fighting infection like cyclosporine, prednisone, tacrolimus -NSAIDS, medicines for pain and inflammation, like celecoxib, diclofenac, flurbiprofen, ibuprofen, meloxicam, naproxen -other medicines for fungal infections -sirolimus -theophylline -tofacitinib This list may not describe all possible interactions. Give your health care provider a list of all the medicines, herbs, non-prescription drugs, or dietary supplements you use. Also tell them if you smoke, drink alcohol, or use illegal drugs. Some items may interact with your medicine. What should I watch for while using this medicine? Visit your doctor or health care professional for regular checkups. If you are taking this medicine for a long time you may need blood work. Tell your doctor if your symptoms do not improve. Some fungal infections need many weeks or months of treatment to cure. Alcohol can increase possible damage to your liver. Avoid alcoholic drinks. If you have a vaginal infection, do not have sex until you have finished your treatment. You can wear a sanitary napkin. Do not use tampons. Wear freshly washed cotton, not synthetic, panties. What side effects may I notice from receiving this medicine? Side effects that  you should report to your doctor or health care professional as soon as possible: -allergic reactions like skin rash or itching, hives, swelling of the  lips, mouth, tongue, or throat -dark urine -feeling dizzy or faint -irregular heartbeat or chest pain -redness, blistering, peeling or loosening of the skin, including inside the mouth -trouble breathing -unusual bruising or bleeding -vomiting -yellowing of the eyes or skin Side effects that usually do not require medical attention (report to your doctor or health care professional if they continue or are bothersome): -changes in how food tastes -diarrhea -headache -stomach upset or nausea This list may not describe all possible side effects. Call your doctor for medical advice about side effects. You may report side effects to FDA at 1-800-FDA-1088. Where should I keep my medicine? Keep out of the reach of children. Store at room temperature below 30 degrees C (86 degrees F). Throw away any medicine after the expiration date. NOTE: This sheet is a summary. It may not cover all possible information. If you have questions about this medicine, talk to your doctor, pharmacist, or health care provider.  2019 Elsevier/Gold Standard (2013-02-01 19:37:38)  Vaginal Yeast infection, Adult  Vaginal yeast infection is a condition that causes vaginal discharge as well as soreness, swelling, and redness (inflammation) of the vagina. This is a common condition. Some women get this infection frequently. What are the causes? This condition is caused by a change in the normal balance of the yeast (candida) and bacteria that live in the vagina. This change causes an overgrowth of yeast, which causes the inflammation. What increases the risk? The condition is more likely to develop in women who:  Take antibiotic medicines.  Have diabetes.  Take birth control pills.  Are pregnant.  Douche often.  Have a weak body defense system (immune system).   Have been taking steroid medicines for a long time.  Frequently wear tight clothing. What are the signs or symptoms? Symptoms of this condition include:  White, thick, creamy vaginal discharge.  Swelling, itching, redness, and irritation of the vagina. The lips of the vagina (vulva) may be affected as well.  Pain or a burning feeling while urinating.  Pain during sex. How is this diagnosed? This condition is diagnosed based on:  Your medical history.  A physical exam.  A pelvic exam. Your health care provider will examine a sample of your vaginal discharge under a microscope. Your health care provider may send this sample for testing to confirm the diagnosis. How is this treated? This condition is treated with medicine. Medicines may be over-the-counter or prescription. You may be told to use one or more of the following:  Medicine that is taken by mouth (orally).  Medicine that is applied as a cream (topically).  Medicine that is inserted directly into the vagina (suppository). Follow these instructions at home:  Lifestyle  Do not have sex until your health care provider approves. Tell your sex partner that you have a yeast infection. That person should go to his or her health care provider and ask if they should also be treated.  Do not wear tight clothes, such as pantyhose or tight pants.  Wear breathable cotton underwear. General instructions  Take or apply over-the-counter and prescription medicines only as told by your health care provider.  Eat more yogurt. This may help to keep your yeast infection from returning.  Do not use tampons until your health care provider approves.  Try taking a sitz bath to help with discomfort. This is a warm water bath that is taken while you are sitting down. The water should only come up to your hips and should cover your  buttocks. Do this 3-4 times per day or as told by your health care provider.  Do not douche.  If you  have diabetes, keep your blood sugar levels under control.  Keep all follow-up visits as told by your health care provider. This is important. Contact a health care provider if:  You have a fever.  Your symptoms go away and then return.  Your symptoms do not get better with treatment.  Your symptoms get worse.  You have new symptoms.  You develop blisters in or around your vagina.  You have blood coming from your vagina and it is not your menstrual period.  You develop pain in your abdomen. Summary  Vaginal yeast infection is a condition that causes discharge as well as soreness, swelling, and redness (inflammation) of the vagina.  This condition is treated with medicine. Medicines may be over-the-counter or prescription.  Take or apply over-the-counter and prescription medicines only as told by your health care provider.  Do not douche. Do not have sex or use tampons until your health care provider approves.  Contact a health care provider if your symptoms do not get better with treatment or your symptoms go away and then return. This information is not intended to replace advice given to you by your health care provider. Make sure you discuss any questions you have with your health care provider. Document Released: 04/05/2005 Document Revised: 11/12/2017 Document Reviewed: 11/12/2017 Elsevier Interactive Patient Education  2019 [employer].

## 2018-12-10 NOTE — Progress Notes (Deleted)
  Subjective:     Patient ID: Kerry [name], female   DOB: 14-Aug-1985, 33 y.o.   MRN: [id number]  HPI   Review of Systems     Objective:   Physical Exam     Assessment:     ***    Plan:     ***

## 2018-12-10 NOTE — Progress Notes (Addendum)
Patient ID: [name], female   DOB: [birth year], 33 y.o.   MRN: [id number]   Virtual Visit via Telephone Note  I connected with Kerry [name] on 12/10/18 at 10:30 AM EDT by telephone and verified that I am speaking with the correct person using two identifiers.  Location: Patient: is at her home  Provider: in office at [facility].    I discussed the limitations, risks, security and privacy concerns of performing an evaluation and management service by telephone and the availability of in person appointments. I also discussed with the patient that there may be a patient responsible charge related to this service. The patient expressed understanding and agreed to proceed.   History of Present Illness:  Patient is a 33 year old female in no acute distress who calls the clinic for a telephone visit for complaint of vaginal itching with discharge x 2 days.  She denies any other vaginal symptoms or concerns. Denies any sores or lesions.  Increased intercourse with her husband over the weekend. She now has had vaginal itching and" white milky discharge" .  She had answered on her questionartre that she was havinig urinary symptoms she clarifies with provider that she is having no urinary symptom, no burning/ hesitancy, frequency or hematuria.   She also answered she has difficulty with bowel movement she clarifies she has a history of constipation though she denies any at this time. She had a normal bowel movement yesterday.   She denies any history of sexually transmitted disease or concern for exposure. She denies pelvic or abdominal pain or tenderness.  Patient  denies any fever, body aches,chills, rash, chest pain, shortness of breath, nausea, vomiting, or diarrhea.   She reports these symptoms are the " same as I always get with yeast infections". She reports her last yeast infection was in January after taking Amoxicillin. She denies any chronic history. She reports  she had to use Diflucan in January as she did not resolve with cream over the counter.    She denies any pregnancy, LMP 11/14/2018 reported by patient as normal menstrual.  Observations/Objective:   Patient is alert and oriented and responsive to questions Engages in conversation with provider. Speaks in full sentences without any pauses without any shortness of breath or distress.     Assessment and Plan:  .Kerry [name] was seen today for vaginal discharge.  Diagnoses and all orders for this visit:  Yeast infection of the vagina  Other orders -     fluconazole (DIFLUCAN) 150 MG tablet; Take 1 tablet (150 mg total) by mouth once for 1 dose.     Follow Up Instructions: Patient is aware after visit summary sent to her My chart- call if any questions.   Follow up immediately with your gynecologist should any symptoms worsen or change. Call this office for an office visit should any urinary symptoms occur.   Advised patient call the office or your primary care doctor for an appointment if no improvement within 72 hours or if any symptoms change or worsen at any time  Advised ER or urgent Care if after hours or on weekend. Call 911 for emergency symptoms at any time.Patinet verbalized understanding of all instructions given/reviewed and treatment plan and has no further questions or concerns at this time.     I discussed the assessment and treatment plan with the patient. The patient was provided an opportunity to ask questions and all were answered. The patient agreed with the plan and  demonstrated an understanding of the instructions.   The patient was advised to call back or seek an in-person evaluation if the symptoms worsen or if the condition fails to improve as anticipated.  I provided 15 minutes of non-face-to-face time during this encounter.   Marcille Buffy, FNP

## 2018-12-13 ENCOUNTER — Telehealth: Payer: Self-pay | Admitting: Adult Health

## 2018-12-13 ENCOUNTER — Ambulatory Visit: Payer: Managed Care, Other (non HMO) | Admitting: Adult Health

## 2018-12-13 ENCOUNTER — Other Ambulatory Visit: Payer: Self-pay

## 2018-12-13 ENCOUNTER — Encounter: Payer: Self-pay | Admitting: Adult Health

## 2018-12-13 VITALS — BP 130/82 | HR 98 | Temp 99.3°F | Resp 16

## 2018-12-13 DIAGNOSIS — Z87898 Personal history of other specified conditions: Secondary | ICD-10-CM

## 2018-12-13 DIAGNOSIS — N3000 Acute cystitis without hematuria: Secondary | ICD-10-CM | POA: Diagnosis not present

## 2018-12-13 DIAGNOSIS — B373 Candidiasis of vulva and vagina: Secondary | ICD-10-CM | POA: Diagnosis not present

## 2018-12-13 DIAGNOSIS — B3731 Acute candidiasis of vulva and vagina: Secondary | ICD-10-CM

## 2018-12-13 LAB — POCT URINE PREGNANCY: Preg Test, Ur: NEGATIVE

## 2018-12-13 LAB — POCT URINALYSIS DIPSTICK
Bilirubin, UA: NEGATIVE
Blood, UA: NEGATIVE
Glucose, UA: NEGATIVE
Ketones, UA: NEGATIVE
Nitrite, UA: NEGATIVE
Protein, UA: NEGATIVE
Spec Grav, UA: 1.025 (ref 1.010–1.025)
Urobilinogen, UA: 0.2 E.U./dL
pH, UA: 6 (ref 5.0–8.0)

## 2018-12-13 MED ORDER — NYSTATIN 100000 UNIT/GM EX CREA: 1.0000 "application " | TOPICAL_CREAM | Freq: Two times a day (BID) | CUTANEOUS | 0 refills | Status: DC

## 2018-12-13 MED ORDER — CEPHALEXIN 500 MG PO CAPS: 500.0000 mg | ORAL_CAPSULE | Freq: Two times a day (BID) | ORAL | 0 refills | Status: DC

## 2018-12-13 MED ORDER — TERCONAZOLE 0.4 % VA CREA: 1.0000 | TOPICAL_CREAM | Freq: Every day | VAGINAL | 0 refills | Status: DC

## 2018-12-13 NOTE — Addendum Note (Signed)
Addended by: Doreen Beam on: 12/13/2018 01:53 PM   Modules accepted: Orders

## 2018-12-13 NOTE — Progress Notes (Addendum)
Subjective:   [facility]   Patient ID: Kerry [name], female   DOB: [birth year], 33 y.o.   MRN: [id number]  HPI   Today's Vitals   12/13/18 1126  BP: 130/82  Pulse: (!) 118  Resp: 16  Temp: 99.3 F (37.4 C)  TempSrc: Oral  SpO2: 99%  PainSc: 0-No pain  recheck heart rate 98  Blood pressure 130/82, pulse 98, temperature 99.3 F (37.4 C), temperature source Oral, resp. rate 16, last menstrual period 12/04/2018, SpO2 99 %, unknown if currently breastfeeding. There is no height or weight on file to calculate BMI.  Patient is 33 year old female in no acute distress who comes to the clinic for complaints of vaginal itching. She was seen by virtual medicine on 12/10/18 and prescribed Diflucan for suspected vaginitis. She reports that are symptoms are 75 percent better after taking the medication prescribed.   She could not get in with her PCP today.  She has had brown/ yellow vaginal discharge scant once  she reports after taking the Diflucan. She denies any vaginal sores, or lesions. She does report that the outside of her vagina feels irritated especially when she urinates. She has dysuria and frequency.  She reports no new sexual partners.  She is not concerned with any STD's exposure.  However she had chlamydia 5 years ago she reports.She denies any abdominal pain or pelvic pain.    She has tachycardia in the office today, she denies chest pain, shortness of breath. She reports she has had this in the past and is usually when she is nervous.   Denies any recent immobilizing surgeries, flights or long travel.  Denies leg or calf pain. Denies any calf or leg swelling bilaterally.    Patient  denies any, body aches,chills, rash, chest pain, shortness of breath, nausea, vomiting, or diarrhea.   Review of Systems  Constitutional: Positive for fever (today ). Negative for activity change, appetite change, chills, diaphoresis, fatigue  and unexpected weight change.  HENT: Negative for congestion, dental problem, drooling, ear discharge, ear pain, facial swelling, hearing loss, mouth sores, nosebleeds, postnasal drip, rhinorrhea, sinus pressure, sinus pain, sneezing, sore throat, tinnitus, trouble swallowing and voice change.   Respiratory: Negative.  Negative for apnea, cough, choking, chest tightness, shortness of breath, wheezing and stridor.   Cardiovascular: Positive for palpitations (started one week ago has had 1- 2 times and lasted 1-5 seconds each time. Vicente Males any currently/ she reports she does get anxious easily / she report sshe was sitting when both episodes occured). Negative for chest pain and leg swelling.  Gastrointestinal: Negative.   Endocrine: Negative for polydipsia, polyphagia and polyuria.  Genitourinary: Negative.   Musculoskeletal: Negative.   Skin: Negative.   Neurological: Negative.   Hematological: Negative.   Psychiatric/Behavioral: Negative.    .     Objective:   Physical Exam Vitals signs reviewed.  Constitutional:      General: She is not in acute distress.    Appearance: Normal appearance. She is not ill-appearing, toxic-appearing or diaphoretic.     Comments: Patient is alert and oriented and responsive to questions Engages in eye contact with provider. Speaks in full sentences without any pauses without any shortness of breath or distress.    HENT:     Head: Normocephalic and atraumatic.     Nose: Nose normal.     Mouth/Throat:     Mouth: Mucous membranes are moist.     Pharynx: Oropharynx is  clear. No oropharyngeal exudate or posterior oropharyngeal erythema.  Eyes:     General:        Right eye: No discharge.        Left eye: No discharge.     Extraocular Movements: Extraocular movements intact.     Conjunctiva/sclera: Conjunctivae normal.     Pupils: Pupils are equal, round, and reactive to light.  Neck:     Musculoskeletal: Normal range of motion and neck supple. No neck  rigidity or muscular tenderness.     Vascular: No carotid bruit.  Cardiovascular:     Rate and Rhythm: Regular rhythm. Tachycardia present.     Pulses: Normal pulses.          Popliteal pulses are 2+ on the right side and 2+ on the left side.       Dorsalis pedis pulses are 2+ on the right side and 2+ on the left side.     Heart sounds: No murmur. No friction rub. No gallop. No S3 or S4 sounds.   Pulmonary:     Effort: Pulmonary effort is normal. No accessory muscle usage or respiratory distress.     Breath sounds: Normal breath sounds and air entry. No stridor, decreased air movement or transmitted upper airway sounds. No decreased breath sounds, wheezing, rhonchi or rales.     Comments: Normal expansion  Chest:     Chest wall: No tenderness.  Abdominal:     General: There is no distension.     Palpations: Abdomen is soft. There is no mass.     Tenderness: There is no abdominal tenderness. There is no right CVA tenderness, left CVA tenderness, guarding or rebound.     Hernia: No hernia is present.  Musculoskeletal:     Right lower leg: No edema.     Left lower leg: No edema.  Lymphadenopathy:     Cervical: No cervical adenopathy.  Skin:    General: Skin is warm and dry.     Capillary Refill: Capillary refill takes less than 2 seconds.     Findings: No erythema or rash.  Neurological:     Mental Status: She is alert and oriented to person, place, and time.     Motor: No weakness.     Gait: Gait normal.  Psychiatric:        Attention and Perception: Attention normal.        Mood and Affect: Mood is anxious. Mood is not depressed. Affect is not angry or inappropriate.        Speech: Speech normal.        Behavior: Behavior normal.        Thought Content: Thought content normal.        Cognition and Memory: Cognition normal.        Judgment: Judgment normal.   Patient moves on and off of exam table and in room without difficulty. Gait is normal in hall and in room. Patient is  oriented to person place time and situation. Patient answers questions appropriately and engages in conversation. Wells score calculation 1.5 low risk  Assessment:    Yeast infection of the vagina - Plan: POCT Urinalysis Dipstick (CPT 81002), CBC with Diff, Comprehensive metabolic panel, EKG 66-ZLDJ, Chlamydia/Gonococcus/Trichomonas, NAA, Urinalysis, Routine w reflex microscopic, Urine Culture, POCT Urine Pregnancy (CPT 81025), CANCELED: Urine Culture, CANCELED: Urinalysis, Routine w reflex microscopic  Acute cystitis without hematuria  History of palpitations - Plan: CBC with Diff, Comprehensive metabolic panel, EKG 57-SVXB, Chlamydia/Gonococcus/Trichomonas, NAA, Thyroid  Panel With TSH, POCT Urine Pregnancy (CPT [phone])  Meds ordered this encounter  Medications  . terconazole (TERAZOL 7) 0.4 % vaginal cream    Sig: Place 1 applicator vaginally at bedtime.    Dispense:  45 g    Refill:  0  . nystatin cream (MYCOSTATIN)    Sig: Apply 1 application topically 2 (two) times daily. Apply to outer vaginal irritation only as directed. No internal use    Dispense:  30 g    Refill:  0  . cephALEXin (KEFLEX) 500 MG capsule    Sig: Take 1 capsule (500 mg total) by mouth 2 (two) times daily.    Dispense:  14 capsule    Refill:  0    Follow up with primary care provider regarding history of palpitations, also sinus tachycardia today could be related to urinary tract infection and mild fever today.    No gynecology exams done in this office currently and no equipment available. Patient is aware she will have to see gynecology if needed for any pelvic/vaginal exam and will follow up with gynecology/obgyn as needed. Yearly gynecology pelvic exam recommended. Patient verbalized understanding of instructions and denies any further questions at this time.   Recommended her to by an over the counter pulse oximetry at [facility] or other store and monitor her heart rate throughout the day at least three times.  If Heart rate stays elevated beyond normal variant high levels given or she develops any other associated symptoms pain, shortness of breath, back pain, abdominal pain, or prolonged palpitations she will go to the emergency room or call 911. She will call for an appointment with Sharyne Peach, MD for 1 week.  She is advised of RED FLAGS, and when to seek emergency medical attention in nearest emergency room or call 911.   Advised patient call the office or your primary care doctor for an appointment if no improvement within 72 hours or if any symptoms change or worsen at any time  Advised ER or urgent Care if after hours or on weekend. Call 911 for emergency symptoms at any time.Patinet verbalized understanding of all instructions given/reviewed and treatment plan and has no further questions or concerns at this time.    Patient verbalized understanding of all instructions given and denies any further questions at this time.

## 2018-12-13 NOTE — Telephone Encounter (Signed)
Patient called 12/13/2018 and reported she felt as if her yeast symptoms have improved but she is now having brown vaginal discharge with vaginal irritation and pain. She is advised this clinic is unable to perform pelvics and wet preps she is advised to call her gynecologist today for office evaluation. She is advised she needs an in office visit. Patient verbalized understanding of all instructions given and denies any further questions at this time.

## 2018-12-13 NOTE — Patient Instructions (Signed)
Terconazole vaginal cream What is this medicine? TERCONAZOLE (ter KON a zole) is an antifungal medicine. It is used to treat yeast infections of the vagina. This medicine may be used for other purposes; ask your health care provider or pharmacist if you have questions. COMMON BRAND NAME(S): Terazol 3, Terazol 7, Zazole What should I tell my health care provider before I take this medicine? They need to know if you have any of these conditions: -an unusual or allergic reaction to terconazole, other antifungals, other medicines, foods, dyes or preservatives -pregnant or trying to get pregnant -breast-feeding How should I use this medicine? This medicine is only for use in the vagina. Do not take by mouth. Wash hands before and after use. Read package directions carefully before using. Use this medicine at bedtime, unless otherwise directed by your doctor or health care professional. Screw the applicator onto the end of the tube and squeeze the tube to fill the applicator. Remove the applicator from the tube. Lie on your back. Gently insert the applicator tip high in the vagina and push the plunger to release the cream into the vagina. Gently remove the applicator. Wash the applicator well with warm water and soap. Use at regular intervals. Do not get this medicine in your eyes. If you do, rinse out with plenty of cool tap water. Finish the full course prescribed by your doctor or health care professional even if you think your condition is better. Do not stop using this medicine if your menstrual period starts during the time of treatment. Talk to your pediatrician regarding the use of this medicine in children. Special care may be needed. Overdosage: If you think you have taken too much of this medicine contact a poison control center or emergency room at once. NOTE: This medicine is only for you. Do not share this medicine with others. What if I miss a dose? If you miss a dose, use it as soon as you  can. If it is almost time for your next dose, use only that dose. Do not use double or extra doses. What may interact with this medicine? Interactions are not expected. Do not use any other vaginal products without telling your doctor or health care professional. This list may not describe all possible interactions. Give your health care provider a list of all the medicines, herbs, non-prescription drugs, or dietary supplements you use. Also tell them if you smoke, drink alcohol, or use illegal drugs. Some items may interact with your medicine. What should I watch for while using this medicine? Tell your doctor or health care professional if your symptoms do not start to get better within a few days. It is better not to have sex until you have finished your treatment. If you have sex, your partner should use a condom during sex to help prevent transfer of the infection. Your sexual partner may also need treatment. Vaginal medicines usually will come out of the vagina during treatment. To keep the medicine from getting on your clothing, wear a mini-pad or sanitary napkin. The use of tampons is not recommended since they may soak up the medicine. To help clear up the infection, wear freshly washed cotton, not synthetic, underwear. What side effects may I notice from receiving this medicine? Side effects that you should report to your doctor or health care professional as soon as possible: -painful or difficult urination -vaginal pain Side effects that usually do not require medical attention (report to your doctor or health care professional if  they continue or are bothersome): -headache -menstrual pain -stomach upset -vaginal irritation, itching or burning This list may not describe all possible side effects. Call your doctor for medical advice about side effects. You may report side effects to FDA at 1-800-FDA-1088. Where should I keep my medicine? Keep out of the reach of children. Store at room  temperature between 15 and 30 degrees C (59 and 86 degrees F). Throw away any unused medicine after the expiration date. NOTE: This sheet is a summary. It may not cover all possible information. If you have questions about this medicine, talk to your doctor, pharmacist, or health care provider.  2019 Elsevier/Gold Standard (2008-03-11 13:51:27) Nystatin skin cream or ointment What is this medicine? NYSTATIN (nye STAT in) is an antifungal medicine. It is used to treat certain kinds of fungal or yeast infections of the skin. This medicine may be used for other purposes; ask your health care provider or pharmacist if you have questions. COMMON BRAND NAME(S): Mycostatin, Nystex, Pediaderm AF What should I tell my health care provider before I take this medicine? They need to know if you have any of these conditions: -an unusual or allergic reaction to nystatin, other foods, dyes or preservatives -pregnant or trying to get pregnant -breast-feeding How should I use this medicine? This medicine is for external use on the skin only. Follow the directions on the prescription label. Wash hands before and after use. If treating a hand or nail infection, wash hands before use only. Apply a thin layer of this medicine to cover the affected skin and surrounding area. You can cover the area with a sterile gauze dressing (bandage). Do not use an airtight bandage (such as a plastic-covered bandage). Do not get the medicine in your eyes. If you do, rinse out with plenty of cool tap water. Use the full course of treatment prescribed, even if you think the infection is getting better. Use at regular intervals. Do not use your medicine more often than directed. Do not use this medicine for any condition other than the one for which it was prescribed. Talk to your pediatrician regarding the use of this medicine in children. Special care may be needed. Overdosage: If you think you have taken too much of this medicine  contact a poison control center or emergency room at once. NOTE: This medicine is only for you. Do not share this medicine with others. What if I miss a dose? If you miss a dose, use it as soon as you can. If it is almost time for your next dose, use only that dose. Do not use double or extra doses. What may interact with this medicine? Interactions are not expected. Do not use any other skin products on the affected area without telling your doctor or health care professional. This list may not describe all possible interactions. Give your health care provider a list of all the medicines, herbs, non-prescription drugs, or dietary supplements you use. Also tell them if you smoke, drink alcohol, or use illegal drugs. Some items may interact with your medicine. What should I watch for while using this medicine? Tell your doctor or health care professional if your symptoms do not improve after 3 days. After bathing make sure that your skin is very dry. Fungal infections like moist conditions. Do not walk around barefoot. To help prevent reinfection, wear freshly washed cotton, not synthetic, clothing. What side effects may I notice from receiving this medicine? Side effects that usually do not require medical  attention (report to your doctor or health care professional if they continue or are bothersome): -skin irritation This list may not describe all possible side effects. Call your doctor for medical advice about side effects. You may report side effects to FDA at 1-800-FDA-1088. Where should I keep my medicine? Keep out of the reach of children. Store at room temperature 15 to 30 degrees C (59 to 86 degrees F). Throw away any unused medicine after the expiration date. NOTE: This sheet is a summary. It may not cover all possible information. If you have questions about this medicine, talk to your doctor, pharmacist, or health care provider.  2019 Elsevier/Gold Standard (2015-07-28 16:28:30)  Cephalexin tablets or capsules What is this medicine? CEPHALEXIN (sef a LEX in) is a cephalosporin antibiotic. It is used to treat certain kinds of bacterial infections It will not work for colds, flu, or other viral infections. This medicine may be used for other purposes; ask your health care provider or pharmacist if you have questions. COMMON BRAND NAME(S): Biocef, Daxbia, Keflex, Keftab What should I tell my health care provider before I take this medicine? They need to know if you have any of these conditions: -kidney disease -stomach or intestine problems, especially colitis -an unusual or allergic reaction to cephalexin, other cephalosporins, penicillins, other antibiotics, medicines, foods, dyes or preservatives -pregnant or trying to get pregnant -breast-feeding How should I use this medicine? Take this medicine by mouth with a full glass of water. Follow the directions on the prescription label. This medicine can be taken with or without food. Take your medicine at regular intervals. Do not take your medicine more often than directed. Take all of your medicine as directed even if you think you are better. Do not skip doses or stop your medicine early. Talk to your pediatrician regarding the use of this medicine in children. While this drug may be prescribed for selected conditions, precautions do apply. Overdosage: If you think you have taken too much of this medicine contact a poison control center or emergency room at once. NOTE: This medicine is only for you. Do not share this medicine with others. What if I miss a dose? If you miss a dose, take it as soon as you can. If it is almost time for your next dose, take only that dose. Do not take double or extra doses. There should be at least 4 to 6 hours between doses. What may interact with this medicine? -probenecid -some other antibiotics This list may not describe all possible interactions. Give your health care provider a list  of all the medicines, herbs, non-prescription drugs, or dietary supplements you use. Also tell them if you smoke, drink alcohol, or use illegal drugs. Some items may interact with your medicine. What should I watch for while using this medicine? Tell your doctor or health care professional if your symptoms do not begin to improve in a few days. Do not treat diarrhea with over the counter products. Contact your doctor if you have diarrhea that lasts more than 2 days or if it is severe and watery. If you have diabetes, you may get a false-positive result for sugar in your urine. Check with your doctor or health care professional. What side effects may I notice from receiving this medicine? Side effects that you should report to your doctor or health care professional as soon as possible: -allergic reactions like skin rash, itching or hives, swelling of the face, lips, or tongue -breathing problems -pain or trouble  passing urine -redness, blistering, peeling or loosening of the skin, including inside the mouth -severe or watery diarrhea -unusually weak or tired -yellowing of the eyes, skin Side effects that usually do not require medical attention (report to your doctor or health care professional if they continue or are bothersome): -gas or heartburn -genital or anal irritation -headache -joint or muscle pain -nausea, vomiting This list may not describe all possible side effects. Call your doctor for medical advice about side effects. You may report side effects to FDA at 1-800-FDA-1088. Where should I keep my medicine? Keep out of the reach of children. Store at room temperature between 59 and 86 degrees F (15 and 30 degrees C). Throw away any unused medicine after the expiration date. NOTE: This sheet is a summary. It may not cover all possible information. If you have questions about this medicine, talk to your doctor, pharmacist, or health care provider.  2019 Elsevier/Gold Standard  (2007-09-30 17:09:13) Palpitations Palpitations are feelings that your heartbeat is not normal. Your heartbeat may feel like it is:  Uneven.  Faster than normal.  Fluttering.  Skipping a beat. This is usually not a serious problem. In some cases, you may need tests to rule out any serious problems. Follow these instructions at home: Pay attention to any changes in your condition. Take these actions to help manage your symptoms: Eating and drinking  Avoid: ? Coffee, tea, soft drinks, and energy drinks. ? Chocolate. ? Alcohol. ? Diet pills. Lifestyle   Try to lower your stress. These things can help you relax: ? Yoga. ? Deep breathing and meditation. ? Exercise. ? Using words and images to create positive thoughts (guided imagery). ? Using your mind to control things in your body (biofeedback).  Do not use drugs.  Get plenty of rest and sleep. Keep a regular bed time. General instructions   Take over-the-counter and prescription medicines only as told by your doctor.  Do not use any products that contain nicotine or tobacco, such as cigarettes and e-cigarettes. If you need help quitting, ask your doctor.  Keep all follow-up visits as told by your doctor. This is important. You may need more tests if palpitations do not go away or get worse. Contact a doctor if:  Your symptoms last more than 24 hours.  Your symptoms occur more often. Get help right away if you:  Have chest pain.  Feel short of breath.  Have a very bad headache.  Feel dizzy.  Pass out (faint). Summary  Palpitations are feelings that your heartbeat is uneven or faster than normal. It may feel like your heart is fluttering or skipping a beat.  Avoid food and drinks that may cause palpitations. These include caffeine, chocolate, and alcohol.  Try to lower your stress. Do not smoke or use drugs.  Get help right away if you faint or have chest pain, shortness of breath, a severe headache, or  dizziness. This information is not intended to replace advice given to you by your health care provider. Make sure you discuss any questions you have with your health care provider. Document Released: 04/04/2008 Document Revised: 08/08/2017 Document Reviewed: 08/08/2017 Elsevier Interactive Patient Education  2019 Elsevier Inc. Sinus Tachycardia  Sinus tachycardia is a kind of fast heartbeat. In sinus tachycardia, the heart beats more than 100 times a minute. Sinus tachycardia starts in a part of the heart called the sinus node. Sinus tachycardia may be harmless, or it may be a sign of a serious condition. What  are the causes? This condition may be caused by:  Exercise or exertion.  A fever.  Pain.  Loss of body fluids (dehydration).  Severe bleeding (hemorrhage).  Anxiety and stress.  Certain substances, including: ? Alcohol. ? Caffeine. ? Tobacco and nicotine products. ? Cold medicines. ? Illegal drugs.  Medical conditions including: ? Heart disease. ? An infection. ? An overactive thyroid (hyperthyroidism). ? A lack of red blood cells (anemia). What are the signs or symptoms? Symptoms of this condition include:  A feeling that the heart is beating quickly (palpitations).  Suddenly noticing your heartbeat (cardiac awareness).  Dizziness.  Tiredness (fatigue).  Shortness of breath.  Chest pain.  Nausea.  Fainting. How is this diagnosed? This condition is diagnosed with:  A physical exam.  Other tests, such as: ? Blood tests. ? An electrocardiogram (ECG). This test measures the electrical activity of the heart. ? Ambulatory cardiac monitor. This records your heartbeats for 24 hours or more. You may be referred to a heart specialist (cardiologist). How is this treated? Treatment for this condition depends on the cause or the underlying condition. Treatment may involve:  Treating the underlying condition.  Taking new medicines or changing your current  medicines as told by your health care provider.  Making changes to your diet or lifestyle. Follow these instructions at home: Lifestyle   Do not use any products that contain nicotine or tobacco, such as cigarettes and e-cigarettes. If you need help quitting, ask your health care provider.  Do not use illegal drugs, such as cocaine.  Learn relaxation methods to help you when you get stressed or anxious. These include deep breathing.  Avoid caffeine or other stimulants. Alcohol use   Do not drink alcohol if: ? Your health care provider tells you not to drink. ? You are pregnant, may be pregnant, or are planning to become pregnant.  If you drink alcohol, limit how much you have: ? 0-1 drink a day for women. ? 0-2 drinks a day for men.  Be aware of how much alcohol is in your drink. In the [place] one drink equals one typical bottle of beer (12 oz), one-half glass of wine (5 oz), or one shot of hard liquor (1 oz). General instructions  Drink enough fluids to keep your urine pale yellow.  Take over-the-counter and prescription medicines only as told by your health care provider.  Keep all follow-up visits as told by your health care provider. This is important. Contact a health care provider if you have:  A fever.  Vomiting or diarrhea that does not go away. Get help right away if you:  Have pain in your chest, upper arms, jaw, or neck.  Become weak or dizzy.  Feel faint.  Have palpitations that do not go away. Summary  In sinus tachycardia, the heart beats more than 100 times a minute.  Sinus tachycardia may be harmless, or it may be a sign of a serious condition.  Treatment for this condition depends on the cause or the underlying condition.  Get help right away if you have pain in your chest, upper arms, jaw, or neck. This information is not intended to replace advice given to you by your health care provider. Make sure you discuss any questions you have with  your health care provider. Document Released: 08/03/2004 Document Revised: 08/15/2017 Document Reviewed: 08/15/2017 Elsevier Interactive Patient Education  2019 [employer].

## 2018-12-14 LAB — COMPREHENSIVE METABOLIC PANEL
ALT: 15 IU/L (ref 0–32)
AST: 19 IU/L (ref 0–40)
Albumin/Globulin Ratio: 1.6 (ref 1.2–2.2)
Albumin: 4.3 g/dL (ref 3.8–4.8)
Alkaline Phosphatase: 71 IU/L (ref 39–117)
BUN/Creatinine Ratio: 8 — ABNORMAL LOW (ref 9–23)
BUN: 7 mg/dL (ref 6–20)
Bilirubin Total: 0.3 mg/dL (ref 0.0–1.2)
CO2: 22 mmol/L (ref 20–29)
Calcium: 9.4 mg/dL (ref 8.7–10.2)
Chloride: 101 mmol/L (ref 96–106)
Creatinine, Ser: 0.93 mg/dL (ref 0.57–1.00)
GFR calc Af Amer: 95 mL/min/{1.73_m2} (ref >59)
GFR calc non Af Amer: 82 mL/min/{1.73_m2} (ref >59)
Globulin, Total: 2.7 g/dL (ref 1.5–4.5)
Glucose: 91 mg/dL (ref 65–99)
Potassium: 4.2 mmol/L (ref 3.5–5.2)
Sodium: 138 mmol/L (ref 134–144)
Total Protein: 7 g/dL (ref 6.0–8.5)

## 2018-12-14 LAB — CBC WITH DIFFERENTIAL/PLATELET
Basophils Absolute: 0 10*3/uL (ref 0.0–0.2)
Basos: 0 %
EOS (ABSOLUTE): 0 10*3/uL (ref 0.0–0.4)
Eos: 0 %
Hematocrit: 37 % (ref 34.0–46.6)
Hemoglobin: 12.9 g/dL (ref 11.1–15.9)
Immature Grans (Abs): 0 10*3/uL (ref 0.0–0.1)
Immature Granulocytes: 0 %
Lymphocytes Absolute: 0.9 10*3/uL (ref 0.7–3.1)
Lymphs: 12 %
MCH: 31.8 pg (ref 26.6–33.0)
MCHC: 34.9 g/dL (ref 31.5–35.7)
MCV: 91 fL (ref 79–97)
Monocytes Absolute: 0.3 10*3/uL (ref 0.1–0.9)
Monocytes: 4 %
Neutrophils Absolute: 6.4 10*3/uL (ref 1.4–7.0)
Neutrophils: 84 %
Platelets: 325 10*3/uL (ref 150–450)
RBC: 4.06 x10E6/uL (ref 3.77–5.28)
RDW: 11.4 % — ABNORMAL LOW (ref 11.7–15.4)
WBC: 7.7 10*3/uL (ref 3.4–10.8)

## 2018-12-14 LAB — THYROID PANEL WITH TSH
Free Thyroxine Index: 2.9 (ref 1.2–4.9)
T3 Uptake Ratio: 23 % — ABNORMAL LOW (ref 24–39)
T4, Total: 12.8 ug/dL — ABNORMAL HIGH (ref 4.5–12.0)
TSH: 0.944 u[IU]/mL (ref 0.450–4.500)

## 2018-12-15 LAB — URINALYSIS, ROUTINE W REFLEX MICROSCOPIC
Bilirubin, UA: NEGATIVE
Glucose, UA: NEGATIVE
Ketones, UA: NEGATIVE
Nitrite, UA: NEGATIVE
Protein,UA: NEGATIVE
RBC, UA: NEGATIVE
Specific Gravity, UA: 1.022 (ref 1.005–1.030)
Urobilinogen, Ur: 0.2 mg/dL (ref 0.2–1.0)
pH, UA: 5.5 (ref 5.0–7.5)

## 2018-12-15 LAB — MICROSCOPIC EXAMINATION: Casts: NONE SEEN /lpf

## 2018-12-15 LAB — URINE CULTURE

## 2018-12-16 ENCOUNTER — Telehealth: Payer: Self-pay | Admitting: Adult Health

## 2018-12-16 DIAGNOSIS — Z0000 Encounter for general adult medical examination without abnormal findings: Secondary | ICD-10-CM

## 2018-12-16 NOTE — Telephone Encounter (Signed)
It was called with her lab results, TSH on low in normal, T3 and T4 are mildly abnormal.  Her RDW is mildly low could be normal variant.  Patient reports that she is feeling much better since she started the antibiotic, reports she is afebrile now.  She denies any new palpitations or tachycardia.  Patient is advised that we will recheck her on Friday 6/12 she will call the office should any symptoms change or worsen.  Patient  denies any fever, body aches,chills, rash, chest pain, shortness of breath, nausea, vomiting, or diarrhea.  She is also advised that she should follow-up in 3 weeks for a repeat TSH panel and CBC at that time, she can do that at this office or follow-up with her primary care provider.  It is recommended that she follow-up with her primary care provider within 1 to 2 weeks. Advised patient call the office or your primary care doctor for an appointment if no improvement within 72 hours or if any symptoms change or worsen at any time  Advised ER or urgent Care if after hours or on weekend. Call 911 for emergency symptoms at any time.Patinet verbalized understanding of all instructions given/reviewed and treatment plan and has no further questions or concerns at this time.     Patient verbalized understanding of all instructions given and denies any further questions at this time.

## 2018-12-20 ENCOUNTER — Telehealth: Payer: Self-pay | Admitting: Adult Health

## 2018-12-20 ENCOUNTER — Ambulatory Visit: Payer: Managed Care, Other (non HMO) | Admitting: Adult Health

## 2018-12-20 NOTE — Telephone Encounter (Signed)
6/12/2020Called [facility] at 9:29 AM, spoke with Marinus Maw who reports that gonorrhea chlamydia trichomonas urine is still pending and that specimen was received.

## 2018-12-23 ENCOUNTER — Other Ambulatory Visit: Payer: Self-pay

## 2018-12-23 ENCOUNTER — Ambulatory Visit: Payer: Managed Care, Other (non HMO) | Admitting: Adult Health

## 2018-12-23 ENCOUNTER — Encounter: Payer: Self-pay | Admitting: Adult Health

## 2018-12-23 VITALS — BP 136/76 | HR 94 | Temp 98.9°F | Resp 16

## 2018-12-23 DIAGNOSIS — Z09 Encounter for follow-up examination after completed treatment for conditions other than malignant neoplasm: Secondary | ICD-10-CM

## 2018-12-23 LAB — CHLAMYDIA/GONOCOCCUS/TRICHOMONAS, NAA
Chlamydia by NAA: NEGATIVE
Gonococcus by NAA: NEGATIVE
Trich vag by NAA: NEGATIVE

## 2018-12-23 NOTE — Progress Notes (Signed)
[facility]  Subjective:     Patient ID: Kerry [name], female   DOB: [birth year], 33 y.o.   MRN: [id number]  Blood pressure 136/76, pulse 94, temperature 98.9 F (37.2 C), temperature source Oral, resp. rate 16, last menstrual period 12/04/2018, SpO2 100 %, unknown if currently breastfeeding. Sharyne Peach, MD  HPI   Patient is a 32 year old female in no acute distress who comes to the clinic for post follow up urinary tract infection. Her heart rate was mildly elevated at last visit.   She does have anxiety.   She reports her urinary symptoms have resolved.  Yeast symptoms have resolved with treatment.  Patient  denies any fever, body aches,chills, rash, chest pain, shortness of breath, nausea, vomiting, or diarrhea.   Patient's last menstrual period was 12/04/2018 (approximate).   Review of Systems  Constitutional: Negative.   HENT: Negative.   Respiratory: Negative.   Cardiovascular: Negative.   Gastrointestinal: Negative.   Genitourinary: Negative.   Musculoskeletal: Negative.   Skin: Negative.   Neurological: Negative.   Hematological: Negative.   Psychiatric/Behavioral: Negative for agitation, behavioral problems, confusion, decreased concentration, dysphoric mood, hallucinations, self-injury, sleep disturbance and suicidal ideas. The patient is not nervous/anxious and is not hyperactive.        Anxiety history.        Objective:   Physical Exam Vitals signs reviewed.  Constitutional:      General: She is not in acute distress.    Appearance: Normal appearance. She is not ill-appearing, toxic-appearing or diaphoretic.  HENT:     Head: Normocephalic and atraumatic.     Right Ear: There is no impacted cerumen.     Left Ear: There is no impacted cerumen.     Nose: Nose normal. No congestion or rhinorrhea.  Eyes:     Conjunctiva/sclera: Conjunctivae normal.     Pupils: Pupils are equal, round, and reactive to  light.  Neck:     Musculoskeletal: Normal range of motion and neck supple.  Cardiovascular:     Rate and Rhythm: Normal rate and regular rhythm.     Pulses: Normal pulses.     Heart sounds: Normal heart sounds. No murmur. No friction rub. No gallop.   Pulmonary:     Effort: Pulmonary effort is normal. No respiratory distress.     Breath sounds: Normal breath sounds. No stridor. No wheezing, rhonchi or rales.  Chest:     Chest wall: No tenderness.  Abdominal:     General: There is no distension.     Palpations: Abdomen is soft. There is no mass.     Tenderness: There is no abdominal tenderness. There is no right CVA tenderness, left CVA tenderness, guarding or rebound.     Hernia: No hernia is present.  Musculoskeletal: Normal range of motion.  Skin:    General: Skin is warm.     Capillary Refill: Capillary refill takes less than 2 seconds.  Neurological:     Mental Status: She is alert and oriented to person, place, and time.  Psychiatric:        Mood and Affect: Mood normal.        Behavior: Behavior normal.        Thought Content: Thought content normal.        Judgment: Judgment normal.        Assessment:     1. Follow up   for urinary tract infection and tachycardia.  Plan:   She will return to office for labs in 3-4 weeks from last visit as discussed.   She has an appointment with Sharyne Peach, MD in one month and will keep that a[ppointment and follow up with him as well. He is her primary care provider.   Follow up with primary care as needed for chronic and maintenance health care- can be seen in this employee clinic for acute care.     Advised patient call the office or your primary care doctor for an appointment if no improvement within 72 hours or if any symptoms change or worsen at any time  Advised ER or urgent Care if after hours or on weekend. Call 911 for emergency symptoms at any time.Patinet verbalized understanding of all instructions  given/reviewed and treatment plan and has no further questions or concerns at this time.    Patient verbalized understanding of all instructions given and denies any further questions at this time.

## 2018-12-23 NOTE — Patient Instructions (Addendum)

## 2018-12-23 NOTE — Progress Notes (Signed)
Negative gonorrhea chlamydia and trichomonas.  Order has been placed for recheck labs in 3 weeks from the date of her previous office visit.  She had does have a recheck office visit today 12/23/2018.  Patient will also follow-up with her primary care as needed.

## 2018-12-24 MED ORDER — FLUCONAZOLE 150 MG PO TABS: 150.0000 mg | ORAL_TABLET | Freq: Once | ORAL | 0 refills | Status: AC

## 2018-12-24 NOTE — Progress Notes (Signed)
Patient called and stated that she is having vaginal itching after completing antibiotic. She requested Diflucan. It will be sent in to her pharmacy [facility] on [address] in [place]. Patient was advised at her office visit to return if any symptoms worsen at any time. Reviewed this with the Provider Sharyn Lull Flinchum FNP-C. Meds ordered this encounter  Medications  . fluconazole (DIFLUCAN) 150 MG tablet    Sig: Take 1 tablet (150 mg total) by mouth once for 1 dose.    Dispense:  1 tablet    Refill:  0

## 2018-12-24 NOTE — Addendum Note (Signed)
Addended by: Judie Petit on: 12/24/2018 01:17 PM   Modules accepted: Orders

## 2019-09-16 ENCOUNTER — Emergency Department
Admission: EM | Admit: 2019-09-16 | Discharge: 2019-09-16 | Disposition: A | Discharge status: Home / Self Care | Payer: Managed Care, Other (non HMO) | Attending: Emergency Medicine | Admitting: Emergency Medicine

## 2019-09-16 ENCOUNTER — Ambulatory Visit: Admission: EM | Admit: 2019-09-16 | Discharge: 2019-09-16 | Payer: Managed Care, Other (non HMO)

## 2019-09-16 ENCOUNTER — Other Ambulatory Visit: Payer: Self-pay

## 2019-09-16 ENCOUNTER — Emergency Department: Payer: Managed Care, Other (non HMO)

## 2019-09-16 DIAGNOSIS — D259 Leiomyoma of uterus, unspecified: Secondary | ICD-10-CM | POA: Diagnosis not present

## 2019-09-16 DIAGNOSIS — N939 Abnormal uterine and vaginal bleeding, unspecified: Secondary | ICD-10-CM

## 2019-09-16 DIAGNOSIS — R102 Pelvic and perineal pain: Secondary | ICD-10-CM | POA: Diagnosis not present

## 2019-09-16 DIAGNOSIS — N938 Other specified abnormal uterine and vaginal bleeding: Secondary | ICD-10-CM | POA: Diagnosis present

## 2019-09-16 LAB — COMPREHENSIVE METABOLIC PANEL
ALT: 15 U/L (ref 0–44)
AST: 17 U/L (ref 15–41)
Albumin: 3.9 g/dL (ref 3.5–5.0)
Alkaline Phosphatase: 64 U/L (ref 38–126)
Anion gap: 10 (ref 5–15)
BUN: 9 mg/dL (ref 6–20)
CO2: 25 mmol/L (ref 22–32)
Calcium: 9.4 mg/dL (ref 8.9–10.3)
Chloride: 102 mmol/L (ref 98–111)
Creatinine, Ser: 0.83 mg/dL (ref 0.44–1.00)
GFR calc Af Amer: >60 mL/min (ref >60)
GFR calc non Af Amer: >60 mL/min (ref >60)
Glucose, Bld: 96 mg/dL (ref 70–99)
Potassium: 4 mmol/L (ref 3.5–5.1)
Sodium: 137 mmol/L (ref 135–145)
Total Bilirubin: 0.6 mg/dL (ref 0.3–1.2)
Total Protein: 7.5 g/dL (ref 6.5–8.1)

## 2019-09-16 LAB — CBC
HCT: 41.3 % (ref 36.0–46.0)
Hemoglobin: 13.5 g/dL (ref 12.0–15.0)
MCH: 31.5 pg (ref 26.0–34.0)
MCHC: 32.7 g/dL (ref 30.0–36.0)
MCV: 96.3 fL (ref 80.0–100.0)
Platelets: 416 10*3/uL — ABNORMAL HIGH (ref 150–400)
RBC: 4.29 MIL/uL (ref 3.87–5.11)
RDW: 11.9 % (ref 11.5–15.5)
WBC: 7.1 10*3/uL (ref 4.0–10.5)
nRBC: 0 % (ref 0.0–0.2)

## 2019-09-16 LAB — LIPASE, BLOOD: Lipase: 25 U/L (ref 11–51)

## 2019-09-16 LAB — PREGNANCY, URINE: Preg Test, Ur: NEGATIVE

## 2019-09-16 MED ORDER — NAPROXEN 500 MG PO TABS: 500.0000 mg | ORAL_TABLET | Freq: Two times a day (BID) | ORAL | 2 refills | Status: DC

## 2019-09-16 MED ORDER — KETOROLAC TROMETHAMINE 30 MG/ML IJ SOLN
30.0000 mg | Freq: Once | INTRAMUSCULAR | Status: AC
  Administered 2019-09-16: 30 mg via INTRAMUSCULAR
  Filled 2019-09-16: qty 1

## 2019-09-16 NOTE — ED Notes (Signed)
Labs sent to lab, no orders placed at this time.

## 2019-09-16 NOTE — ED Notes (Signed)
Pt being transported to US at this time.

## 2019-09-16 NOTE — ED Provider Notes (Signed)
[facility] Emergency Department Provider Note   ____________________________________________    I have reviewed the triage vital signs and the nursing notes.   HISTORY  Chief Complaint Vaginal Bleeding     HPI Kerry [name] is a 34 y.o. female who presents with complaints of vaginal bleeding.  Patient reports that she is not expecting her menstrual cycle until next week and she has been having heavier bleeding occasionally with clots over the last several days to a week.  She denies lightheadedness.  Some abdominal discomfort which is mild.  No fevers or chills.  Has never had this before.  Has not take anything for this.  Past Medical History:  Diagnosis Date  . Anxiety 04/26/2011  . Post partum depression     Patient Active Problem List   Diagnosis Date Noted  . History of palpitations 12/13/2018  . Yeast infection of the vagina 12/10/2018  . Goiter 08/24/2017  . S/P cesarean section 07/21/2016  . Anxiety 04/26/2011    Past Surgical History:  Procedure Laterality Date  . CERVICAL CERCLAGE N/A 06/30/2016   Procedure: Exam Under Anesthesia;  Surgeon: Mora Bellman, MD;  Location: [facility];  Service: Gynecology;  Laterality: N/A;  . right foot surgery  2014    Prior to Admission medications   Medication Sig Start Date End Date Taking? Authorizing Provider  cephALEXin (KEFLEX) 500 MG capsule Take 1 capsule (500 mg total) by mouth 2 (two) times daily. 12/13/18   Flinchum, Kelby Aline, FNP  Multiple Vitamin (MULTIVITAMIN) tablet Take 1 tablet by mouth daily.    [provider]  norgestimate-ethinyl estradiol (ORTHO-CYCLEN,SPRINTEC,PREVIFEM) 0.25-35 MG-MCG tablet Take 1 tablet by mouth daily.    [provider]  nystatin cream (MYCOSTATIN) Apply 1 application topically 2 (two) times daily. Apply to outer vaginal irritation only as directed. No internal use 12/13/18   Flinchum, Kelby Aline, FNP  SF 1.1 % GEL dental gel USE 1  DAILY AT BEDTIME AFTER BRUSHING 12/12/18   [provider]  terconazole (TERAZOL 7) 0.4 % vaginal cream Place 1 applicator vaginally at bedtime. 12/13/18   Flinchum, Kelby Aline, FNP     Allergies Patient has no known allergies.  Family History  Problem Relation Age of Onset  . Hypertension Father   . Gout Father   . Hypertension Mother   . Breast cancer Maternal Aunt 38  . Cancer Maternal Aunt 24       breast  . Hypertension Maternal Grandmother   . Hypertension Maternal Grandfather     Social History Social History   Tobacco Use  . Smoking status: Never Smoker  . Smokeless tobacco: Never Used  Substance Use Topics  . Alcohol use: No    Comment: rare  . Drug use: No    Review of Systems  Constitutional: No fever/chills Eyes: No visual changes.  ENT: No sore throat. Cardiovascular: Denies chest pain. Respiratory: Denies shortness of breath. Gastrointestinal: As above Genitourinary: As above Musculoskeletal: Negative for back pain. Skin: Negative for rash. Neurological: Negative for headaches or weakness   ____________________________________________   PHYSICAL EXAM:  VITAL SIGNS: ED Triage Vitals  Enc Vitals Group     BP 09/16/19 1442 129/73     Pulse Rate 09/16/19 1442 (!) 101     Resp 09/16/19 1711 16     Temp 09/16/19 1442 98.6 F (37 C)     Temp Source 09/16/19 1442 Oral     SpO2 09/16/19 1442 100 %  Weight 09/16/19 1447 96.6 kg (213 lb)     Height 09/16/19 1447 1.626 m (5\' 4" )     Head Circumference --      Peak Flow --      Pain Score 09/16/19 1447 5     Pain Loc --      Pain Edu? --      Excl. in [place]? --     Constitutional: Alert and oriented.   Nose: No congestion/rhinnorhea.  Cardiovascular: Normal rate, regular rhythm. Grossly normal heart sounds.  Good peripheral circulation. Respiratory: Normal respiratory effort.  No retractions. Lungs CTAB. Gastrointestinal: Soft and nontender.  Reassuring exam Genitourinary:  deferred Musculoskeletal: No lower extremity tenderness nor edema.  Warm and well perfused Neurologic:  Normal speech and language. No gross focal neurologic deficits are appreciated.  Skin:  Skin is warm, dry and intact. No rash noted. Psychiatric: Mood and affect are normal. Speech and behavior are normal.  ____________________________________________   LABS (all labs ordered are listed, but only abnormal results are displayed)  Labs Reviewed  CBC - Abnormal; Notable for the following components:      Result Value   Platelets 416 (*)    All other components within normal limits  COMPREHENSIVE METABOLIC PANEL  LIPASE, BLOOD  PREGNANCY, URINE   ____________________________________________  EKG  None ____________________________________________  RADIOLOGY  Ultrasound demonstrates multiple uterine fibroids ____________________________________________   PROCEDURES  Procedure(s) performed: No  Procedures   Critical Care performed: No ____________________________________________   INITIAL IMPRESSION / ASSESSMENT AND PLAN / ED COURSE  Pertinent labs & imaging results that were available during my care of the patient were reviewed by me and considered in my medical decision making (see chart for details).  Patient presents with abnormal uterine bleeding, pregnancy test is negative.  Lab work is reassuring, hemoglobin is stable.  Ultrasound performed which demonstrates multiple uterine fibroids, I suspect these are the cause of her bleeding.  I will refer her to gynecology for further evaluation.    ____________________________________________   FINAL CLINICAL IMPRESSION(S) / ED DIAGNOSES  Final diagnoses:  Pelvic pain  Abnormal uterine bleeding  Uterine leiomyoma, unspecified location        Note:  This document was prepared using Dragon voice recognition software and may include unintentional dictation errors.   Lavonia Drafts, MD 09/16/19 Vernelle Emerald

## 2019-09-16 NOTE — ED Triage Notes (Addendum)
Pt comes via POV from home with c/o of vaginal bleeding. Pt states she has had some sharp pains not like menstrual cramps. Pt states multiple pads and panty liners being used.  Pt states some diarrhea. Pt unsure if pregnant. Pt states next period is to start next week.  Pt states lower abdominal pain that radiates around to her back. Pt denies any urinary symptoms.

## 2019-09-24 ENCOUNTER — Other Ambulatory Visit: Payer: Self-pay

## 2019-09-24 ENCOUNTER — Ambulatory Visit (INDEPENDENT_AMBULATORY_CARE_PROVIDER_SITE_OTHER): Payer: Managed Care, Other (non HMO) | Admitting: Obstetrics & Gynecology

## 2019-09-24 ENCOUNTER — Encounter: Payer: Self-pay | Admitting: Obstetrics & Gynecology

## 2019-09-24 VITALS — BP 120/80 | Ht 64.0 in | Wt 212.0 lb

## 2019-09-24 DIAGNOSIS — D219 Benign neoplasm of connective and other soft tissue, unspecified: Secondary | ICD-10-CM

## 2019-09-24 DIAGNOSIS — D259 Leiomyoma of uterus, unspecified: Secondary | ICD-10-CM

## 2019-09-24 DIAGNOSIS — N921 Excessive and frequent menstruation with irregular cycle: Secondary | ICD-10-CM | POA: Diagnosis not present

## 2019-09-24 DIAGNOSIS — N946 Dysmenorrhea, unspecified: Secondary | ICD-10-CM

## 2019-09-24 MED ORDER — LO LOESTRIN FE 1 MG-10 MCG / 10 MCG PO TABS: 1.0000 | ORAL_TABLET | Freq: Every day | ORAL | 3 refills | Status: DC

## 2019-09-24 NOTE — Patient Instructions (Signed)

## 2019-09-24 NOTE — Progress Notes (Signed)
  Uterine Fibroids Patient is a 34 yo G2P0202 (24 weeks, 34 weeks PTD for cerv incompetence, both A&W)  presents with concern over change in periods and dx thru ER recently by ultrasound of uterine fibroids. Periods are regular every 28-30 days, lasting a few days. Dysmenorrhea:severe, occurring first 1-2 days of flow. Cyclic symptoms include none. No intermenstrual bleeding, spotting, or discharge until this past month when she had BTB for 10 days mid cycle.   OCPs no help.  Recent testing for thyroid abnormality.  PMHx: She  has a past medical history of Anxiety (04/26/2011) and Post partum depression. Also,  has a past surgical history that includes right foot surgery (2014) and Cervical cerclage (N/A, 06/30/2016)., family history includes Breast cancer (age of onset: 34) in her maternal aunt; Cancer (age of onset: 20) in her maternal aunt; Gout in her father; Hypertension in her father, maternal grandfather, maternal grandmother, and mother.,  reports that she has never smoked. She has never used smokeless tobacco. She reports that she does not drink alcohol or use drugs.  She has a current medication list which includes the following prescription(s): multivitamin, norgestimate-ethinyl estradiol, cephalexin, naproxen, lo loestrin fe, nystatin cream, sf, and terconazole. Also, has No Known Allergies.  Review of Systems  Constitutional: Positive for malaise/fatigue. Negative for chills and fever.  HENT: Negative for congestion, sinus pain and sore throat.   Eyes: Negative for blurred vision and pain.  Respiratory: Negative for cough and wheezing.   Cardiovascular: Negative for chest pain and leg swelling.  Gastrointestinal: Positive for abdominal pain. Negative for constipation, diarrhea, heartburn, nausea and vomiting.  Genitourinary: Positive for dysuria. Negative for frequency, hematuria and urgency.  Musculoskeletal: Negative for back pain, joint pain, myalgias and neck pain.  Skin: Negative  for itching and rash.  Neurological: Negative for dizziness, tremors and weakness.  Endo/Heme/Allergies: Does not bruise/bleed easily.  Psychiatric/Behavioral: Negative for depression. The patient is nervous/anxious. The patient does not have insomnia.     Objective: BP 120/80   Ht 5\' 4"  (1.626 m)   Wt 212 lb (96.2 kg)   LMP 09/01/2019   BMI 36.39 kg/m  Physical Exam Constitutional:      General: She is not in acute distress.    Appearance: She is well-developed.  Musculoskeletal:        General: Normal range of motion.  Neurological:     Mental Status: She is alert and oriented to person, place, and time.  Skin:    General: Skin is warm and dry.  Vitals reviewed.   [place] reviewed from [facility]  ASSESSMENT/PLAN:    Problem List Items Addressed This Visit      Other   Fibroids - Primary   Relevant Orders   US PELVIC COMPLETE WITH TRANSVAGINAL   Menometrorrhagia   Relevant Orders   US PELVIC COMPLETE WITH TRANSVAGINAL    [place] and exam w PAP nv Monitor pattern for bleeding, as this is first month w abnormality Considering future fertility See what happens w thyroid management as well  A total of 45 minutes were spent face-to-face with the patient as well as preparation, review, communication, and documentation during this encounter.    Barnett Applebaum, MD, [facility], [facility] 09/24/2019  5:20 PM

## 2019-11-24 ENCOUNTER — Other Ambulatory Visit: Payer: Self-pay | Admitting: Obstetrics & Gynecology

## 2019-11-24 ENCOUNTER — Encounter: Payer: Self-pay | Admitting: Obstetrics & Gynecology

## 2019-11-24 ENCOUNTER — Ambulatory Visit (INDEPENDENT_AMBULATORY_CARE_PROVIDER_SITE_OTHER): Payer: Managed Care, Other (non HMO)

## 2019-11-24 ENCOUNTER — Ambulatory Visit (INDEPENDENT_AMBULATORY_CARE_PROVIDER_SITE_OTHER): Payer: Managed Care, Other (non HMO) | Admitting: Obstetrics & Gynecology

## 2019-11-24 ENCOUNTER — Other Ambulatory Visit: Payer: Self-pay

## 2019-11-24 VITALS — BP 120/80 | Ht 63.0 in | Wt 213.0 lb

## 2019-11-24 DIAGNOSIS — N921 Excessive and frequent menstruation with irregular cycle: Secondary | ICD-10-CM

## 2019-11-24 DIAGNOSIS — D219 Benign neoplasm of connective and other soft tissue, unspecified: Secondary | ICD-10-CM | POA: Diagnosis not present

## 2019-11-24 NOTE — Progress Notes (Signed)
HPI: Uterine Fibroids Patient presents with uterine fibroids. Periods are regular, lasting several days. Dysmenorrhea:moderate, occurring throughout menses. Cyclic symptoms include none. No intermenstrual bleeding, spotting, or discharge.  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  Fibroid have affected previous pregnancies, prior PTD.  Does not want any more pregnancies, and she has thought about this long and hard and also discussed w spouse who is in agreement per pt.  Ultrasound demonstrates 6 dominant fibroids  PMHx: She  has a past medical history of Anxiety (04/26/2011) and Post partum depression. Also,  has a past surgical history that includes right foot surgery (2014) and Cervical cerclage (N/A, 06/30/2016)., family history includes Breast cancer (age of onset: 70) in her maternal aunt; Cancer (age of onset: 49) in her maternal aunt; Gout in her father; Hypertension in her father, maternal grandfather, maternal grandmother, and mother.,  reports that she has never smoked. She has never used smokeless tobacco. She reports that she does not drink alcohol or use drugs.  She has a current medication list which includes the following prescription(s): lo loestrin fe, cephalexin, multivitamin, naproxen, norgestimate-ethinyl estradiol, nystatin cream, sf, and terconazole. Also, has No Known Allergies.  Review of Systems  Constitutional: Positive for malaise/fatigue. Negative for chills and fever.  HENT: Negative for congestion, sinus pain and sore throat.   Eyes: Negative for blurred vision and pain.  Respiratory: Negative for cough and wheezing.   Cardiovascular: Negative for chest pain and leg swelling.  Gastrointestinal: Positive for abdominal pain. Negative for constipation, diarrhea, heartburn, nausea and vomiting.  Genitourinary: Negative for dysuria, frequency, hematuria and urgency.  Musculoskeletal: Negative for back pain, joint pain, myalgias and neck pain.  Skin: Negative for itching and  rash.  Neurological: Negative for dizziness, tremors and weakness.  Endo/Heme/Allergies: Does not bruise/bleed easily.  Psychiatric/Behavioral: Negative for depression. The patient is not nervous/anxious and does not have insomnia.     Objective: BP 120/80    Ht 5\' 3"  (1.6 m)    Wt 213 lb (96.6 kg)    LMP 11/18/2019    BMI 37.73 kg/m   Physical examination Constitutional NAD, Conversant  Skin No rashes, lesions or ulceration.   Extremities: Moves all appropriately.  Normal ROM for age. No lymphadenopathy.  Neuro: Grossly intact  Psych: Oriented to PPT.  Normal mood. Normal affect.   US PELVIS TRANSVAGINAL NON-OB (TV ONLY)  Result Date: 11/24/2019 Patient Name: Kerry [name] DOB: 05-Oct-1985 MRN: [id number] ULTRASOUND REPORT Location: [facility] Date of Service: 11/24/2019 Indications:Abnormal Uterine Bleeding Findings: The uterus is anteverted and measures 10.8 x 6.1 x 5.3 cm. Echo texture is heterogenous with evidence of focal masses. Within the uterus are multiple suspected fibroids measuring: Fibroid 1:24.3 x 17.6 x 21.0 mm intramural vs. Subserosal fundal Fibroid 2:32.6 x 31.0 x 39.4 mm subserosal left Fibroid 3: 16.4 x 14.6 x 17.4 mm intramural left Fibroid 4: 16.6 x 15.2 x 16.5 mm intramural left Fibroid 5: 20.1 x 21.3 x 15.9 mm subserosal right Fibroid 6: 19.1 x 18.9 x 17.2 mm subserosal right The Endometrium measures 2.3 mm. Right Ovary measures 2.5 x 1.9 x 1.5 cm. It is normal in appearance. Left Ovary measures 3.1 x 1.9 x 1.7 cm. It is normal in appearance. Survey of the adnexa demonstrates no adnexal masses. There is no free fluid in the cul de sac. Impression: 1. There are at least 6 uterine fibroids. One fibroid may be partially submucosal. 2. Thin endometrium. 3. Normal appearing cervix and ovaries. Recommendations: 1.Clinical correlation with  the patient's History and Physical Exam. Gweneth Dimitri, RT Review of ULTRASOUND.    I have personally reviewed images and report of  recent ultrasound done at [facility].    Plan of management to be discussed with patient. Barnett Applebaum, MD, [facility], [facility] 11/24/2019  4:48 PM   Assessment:    ICD-10-CM   1. Fibroids  D21.9   2. Menometrorrhagia  N92.1   Options discussed Fibroid treatment such as [place], Lupron, Myomectomy, and Hysterectomy discussed in detail, with the pros and cons of each choice counseled.  No treatment as an option also discussed, as well as control of symptoms alone with hormone therapy. Information provided to the patient.  A systematic review was performed to evaluate if uterine myomas impact the likelihood of pregnancy and pregnancy loss, and if myomectomy influences pregnancy outcomes in asymptomatic women. There is insufficient evidence to conclude that the presence of myomas reduces the likelihood of achieving pregnancy. However, there is fair evidence that myomectomy (open or laparoscopic) for cavity-distorting myomas (intramural or intramural with a submucosal component) improves pregnancy rates and reduces the risk of early pregnancy loss. There is fair evidence that hysteroscopic myomectomy for cavity-distorting myomas improves clinical pregnancy rates but insufficient evidence regarding the impact of this procedure on the likelihood of live birth or early pregnancy loss. In women with asymptomatic cavity-distorting myomas, myomectomy may be considered to optimize pregnancy outcomes.  Pt at this time prefers hysterectomy.  Lengthy discussion of all options including myomectomy, Lupron, and [place] to preserve uterus discussed  A total of 25 minutes were spent face-to-face with the patient as well as preparation, review, communication, and documentation during this encounter.   Barnett Applebaum, MD, [facility], [facility] 11/24/2019  5:14 PM

## 2019-11-24 NOTE — Patient Instructions (Signed)
Total Laparoscopic Hysterectomy A total laparoscopic hysterectomy is a minimally invasive surgery to remove the uterus and cervix. The fallopian tubes and ovaries can also be removed (bilateral salpingo-oophorectomy) during this surgery, if necessary. This procedure may be done to treat problems such as:  Noncancerous growths in the uterus (uterine fibroids) that cause symptoms.  A condition that causes the lining of the uterus (endometrium) to grow in other areas (endometriosis).  Problems with pelvic support. This is caused by weakened muscles of the pelvis following vaginal childbirth or menopause.  Cancer of the cervix, ovaries, uterus, or endometrium.  Excessive (dysfunctional) uterine bleeding. This surgery is performed by inserting a thin, lighted tube (laparoscope) and surgical instruments into small incisions in the abdomen. The laparoscope sends images to a monitor. The images help the health care provider perform the procedure. After this procedure, you will no longer be able to have a baby, and you will no longer have a menstrual period. Tell a health care provider about:  Any allergies you have.  All medicines you are taking, including vitamins, herbs, eye drops, creams, and over-the-counter medicines.  Any problems you or family members have had with anesthetic medicines.  Any blood disorders you have.  Any surgeries you have had.  Any medical conditions you have.  Whether you are pregnant or may be pregnant. What are the risks? Generally, this is a safe procedure. However, problems may occur, including:  Infection.  Bleeding.  Blood clots in the legs or lungs.  Allergic reactions to medicines.  Damage to other structures or organs.  The risk that the surgery may have to be switched to the regular one in which a large incision is made in the abdomen (abdominal hysterectomy). What happens before the procedure? Staying hydrated Follow instructions from your  health care provider about hydration, which may include:  Up to 2 hours before the procedure - you may continue to drink clear liquids, such as water, clear fruit juice, black coffee, and plain tea Eating and drinking restrictions Follow instructions from your health care provider about eating and drinking, which may include:  8 hours before the procedure - stop eating heavy meals or foods such as meat, fried foods, or fatty foods.  6 hours before the procedure - stop eating light meals or foods, such as toast or cereal.  6 hours before the procedure - stop drinking milk or drinks that contain milk.  2 hours before the procedure - stop drinking clear liquids. Medicines  Ask your health care provider about: ? Changing or stopping your regular medicines. This is especially important if you are taking diabetes medicines or blood thinners. ? Taking over-the-counter medicines, vitamins, herbs, and supplements. ? Taking medicines such as aspirin and ibuprofen. These medicines can thin your blood. Do not take these medicines unless your health care provider tells you to take them.  You may be given antibiotic medicine to help prevent infection.  You may be asked to take laxatives.  You may be given medicines to help prevent nausea and vomiting after the procedure. General instructions  Ask your health care provider how your surgical site will be marked or identified.  You may be asked to shower with a germ-killing soap.  Do not use any products that contain nicotine or tobacco, such as cigarettes and e-cigarettes. If you need help quitting, ask your health care provider.  You may have an exam or testing, such as an ultrasound to determine the size and shape of your pelvic organs.    You may have a blood or urine sample taken.  This procedure can affect the way you feel about yourself. Talk with your health care provider about the physical and emotional changes hysterectomy may  cause.  Plan to have someone take you home from the hospital or clinic.  Plan to have a responsible adult care for you for at least 24 hours after you leave the hospital or clinic. This is important. What happens during the procedure?  To lower your risk of infection: ? Your health care team will wash or sanitize their hands. ? Your skin will be washed with soap. ? Hair may be removed from the surgical area.  An IV will be inserted into one of your veins.  You will be given one or more of the following: ? A medicine to help you relax (sedative). ? A medicine to make you fall asleep (general anesthetic).  You will be given antibiotic medicine through your IV.  A tube may be inserted down your throat to help you breathe during the procedure.  A gas (carbon dioxide) will be used to inflate your abdomen to allow your surgeon to see inside of your abdomen.  Three or four small incisions will be made in your abdomen.  A laparoscope will be inserted into one of your incisions. Surgical instruments will be inserted through the other incisions in order to perform the procedure.  Your uterus and cervix may be removed through your vagina or cut into small pieces and removed through the small incisions. Any other organs that need to be removed will also be removed this way.  Carbon dioxide will be released from inside of your abdomen.  Your incisions will be closed with stitches (sutures).  A bandage (dressing) may be placed over your incisions. The procedure may vary among health care providers and hospitals. What happens after the procedure?  Your blood pressure, heart rate, breathing rate, and blood oxygen level will be monitored until the medicines you were given have worn off.  You will be given medicine for pain and nausea as needed.  Do not drive for 24 hours if you received a sedative. Summary  Total Laparoscopic hysterectomy is a procedure to remove your uterus, cervix and  sometimes the fallopian tubes and ovaries.  This procedure can affect the way you feel about yourself. Talk with your health care provider about the physical and emotional changes hysterectomy may cause.  After this procedure, you will no longer be able to have a baby, and you will no longer have a menstrual period.  You will be given pain medicine to control discomfort after this procedure. This information is not intended to replace advice given to you by your health care provider. Make sure you discuss any questions you have with your health care provider. Document Revised: 06/08/2017 Document Reviewed: 09/06/2016 Elsevier Patient Education  2020 [employer]vier Inc.  

## 2019-11-27 ENCOUNTER — Telehealth: Payer: Self-pay | Admitting: Obstetrics & Gynecology

## 2019-11-27 NOTE — Telephone Encounter (Signed)
Called pt a scheduled surgery with Kaylyn Layer 7/13 TLH/BS  H&P 7/2 @ 9:20  Covid 7/9 [address], drive up and wear mask, adv pt to quar until [employer]  Will request Pre admit appt and MyChart will be updated with info and also pt will rec at H&P  Adv may rec calls from [place] and pre service ctr  [place] ins

## 2019-11-27 NOTE — Telephone Encounter (Signed)
-----   Message from Gae Dry, MD sent at 11/24/2019  5:14 PM EDT ----- Regarding: Surgery Surgery Booking Request Patient Full Name:  Kerry [name]  MRN: [id number]  DOB: [birth year]  Surgeon: Hoyt Koch, MD  Requested Surgery Date and Time: Any soon Primary Diagnosis AND Code:   ICD-10-CM  1. Fibroids  D21.9  2. Menometrorrhagia  N92.1  Secondary Diagnosis and Code:  Surgical Procedure: TLH/BS L&D Notification: No Admission Status: same day surgery Length of Surgery: 1.2 hr Special Case Needs: No H&P: Yes Phone Interview???:  Yes Interpreter: No Language:  Medical Clearance:  No Special Scheduling Instructions: no Any known health/anesthesia issues, diabetes, sleep apnea, latex allergy, defibrillator/pacemaker?: No Acuity: P3   (P1 highest, P2 delay may cause harm, P3 low, elective gyn, P4 lowest)

## 2020-01-09 ENCOUNTER — Telehealth: Payer: Self-pay | Admitting: Obstetrics & Gynecology

## 2020-01-09 ENCOUNTER — Other Ambulatory Visit: Payer: Self-pay

## 2020-01-09 ENCOUNTER — Encounter: Payer: Managed Care, Other (non HMO) | Admitting: Obstetrics & Gynecology

## 2020-01-09 ENCOUNTER — Encounter: Payer: Self-pay | Admitting: Obstetrics & Gynecology

## 2020-01-09 ENCOUNTER — Ambulatory Visit (INDEPENDENT_AMBULATORY_CARE_PROVIDER_SITE_OTHER): Payer: Managed Care, Other (non HMO) | Admitting: Obstetrics & Gynecology

## 2020-01-09 VITALS — BP 120/80 | Ht 63.0 in | Wt 220.0 lb

## 2020-01-09 DIAGNOSIS — D219 Benign neoplasm of connective and other soft tissue, unspecified: Secondary | ICD-10-CM

## 2020-01-09 DIAGNOSIS — N921 Excessive and frequent menstruation with irregular cycle: Secondary | ICD-10-CM

## 2020-01-09 NOTE — Patient Instructions (Signed)

## 2020-01-09 NOTE — H&P (View-Only) (Signed)
PRE-OPERATIVE HISTORY AND PHYSICAL EXAM  HPI:  Kerry [name] is a 34 y.o. G2P0202 Patient's last menstrual period was 12/13/2019.; she is being admitted for surgery related to abnormal uterine bleeding and fibroids.  Periods are regular, lasting several days. Dysmenorrhea:moderate, occurring throughout menses. Cyclic symptoms include none. No intermenstrual bleeding, spotting, or discharge.  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  Fibroid have affected previous pregnancies, prior PTD.  Does not want any more pregnancies, and she has thought about this long and hard and also discussed w spouse who is in agreement per pt.  Ultrasound Findings:  The uterus is anteverted and measures 10.8 x 6.1 x 5.3 cm. Echo texture is heterogenous with evidence of focal masses. Within the uterus are multiple suspected fibroids measuring: Fibroid 1:24.3 x 17.6 x 21.0 mm intramural vs. Subserosal fundal Fibroid 2:32.6 x 31.0 x 39.4 mm subserosal left Fibroid 3: 16.4 x 14.6 x 17.4 mm intramural left Fibroid 4: 16.6 x 15.2 x 16.5 mm intramural left Fibroid 5: 20.1 x 21.3 x 15.9 mm subserosal right Fibroid 6: 19.1 x 18.9 x 17.2 mm subserosal right The Endometrium measures 2.3 mm.  PMHx: Past Medical History:  Diagnosis Date  . Anxiety 04/26/2011  . Post partum depression    Past Surgical History:  Procedure Laterality Date  . CERVICAL CERCLAGE N/A 06/30/2016   Procedure: Exam Under Anesthesia;  Surgeon: Mora Bellman, MD;  Location: [facility];  Service: Gynecology;  Laterality: N/A;  . right foot surgery  2014   Family History  Problem Relation Age of Onset  . Hypertension Father   . Gout Father   . Hypertension Mother   . Breast cancer Maternal Aunt 38  . Cancer Maternal Aunt 60       breast  . Hypertension Maternal Grandmother   . Hypertension Maternal Grandfather    Social History   Tobacco Use  . Smoking status: Never Smoker  . Smokeless tobacco: Never Used  Substance Use Topics  .  Alcohol use: No    Comment: rare  . Drug use: No    Current Outpatient Medications:  .  loratadine (CLARITIN) 10 MG tablet, Take 10 mg by mouth daily., Disp: , Rfl:  .  Multiple Vitamin (MULTIVITAMIN) tablet, Take 1 tablet by mouth daily., Disp: , Rfl:  .  OVER THE COUNTER MEDICATION, Take 2 tablets by mouth daily. Goli Gummies, Disp: , Rfl:  .  Norethindrone-Ethinyl Estradiol-Fe Biphas (LO LOESTRIN FE) 1 MG-10 MCG / 10 MCG tablet, Take 1 tablet by mouth daily., Disp: 84 tablet, Rfl: 3 Allergies: Patient has no known allergies.  Review of Systems  Constitutional: Negative for chills, fever and malaise/fatigue.  HENT: Negative for congestion, sinus pain and sore throat.   Eyes: Negative for blurred vision and pain.  Respiratory: Negative for cough and wheezing.   Cardiovascular: Negative for chest pain and leg swelling.  Gastrointestinal: Positive for abdominal pain. Negative for constipation, diarrhea, heartburn, nausea and vomiting.  Genitourinary: Negative for dysuria, frequency, hematuria and urgency.  Musculoskeletal: Negative for back pain, joint pain, myalgias and neck pain.  Skin: Negative for itching and rash.  Neurological: Negative for dizziness, tremors and weakness.  Endo/Heme/Allergies: Does not bruise/bleed easily.  Psychiatric/Behavioral: Negative for depression. The patient is not nervous/anxious and does not have insomnia.     Objective: BP 120/80   Ht 5\' 3"  (1.6 m)   Wt 220 lb (99.8 kg)   LMP 12/13/2019   BMI 38.97 kg/m   Filed  Weights   01/09/20 1103  Weight: 220 lb (99.8 kg)   Physical Exam Constitutional:      General: She is not in acute distress.    Appearance: She is well-developed.  HENT:     Head: Normocephalic and atraumatic. No laceration.     Right Ear: Hearing normal.     Left Ear: Hearing normal.     Mouth/Throat:     Pharynx: Uvula midline.  Eyes:     Pupils: Pupils are equal, round, and reactive to light.  Neck:     Thyroid: No  thyromegaly.  Cardiovascular:     Rate and Rhythm: Normal rate and regular rhythm.     Heart sounds: No murmur heard.  No friction rub. No gallop.   Pulmonary:     Effort: Pulmonary effort is normal. No respiratory distress.     Breath sounds: Normal breath sounds. No wheezing.  Chest:     Breasts:        Right: No mass, skin change or tenderness.        Left: No mass, skin change or tenderness.  Abdominal:     General: Bowel sounds are normal. There is no distension.     Palpations: Abdomen is soft.     Tenderness: There is no abdominal tenderness. There is no rebound.  Musculoskeletal:        General: Normal range of motion.     Cervical back: Normal range of motion and neck supple.  Neurological:     Mental Status: She is alert and oriented to person, place, and time.     Cranial Nerves: No cranial nerve deficit.  Skin:    General: Skin is warm and dry.  Psychiatric:        Judgment: Judgment normal.  Vitals reviewed.    Assessment: 1. Fibroids   2. Menometrorrhagia   Plan TLH BS for her symptoms, as alternatives have been discussed No desire for pregnancy again in the future Consent obtained  I have had a careful discussion with this patient about all the options available and the risk/benefits of each. I have fully informed this patient that surgery may subject her to a variety of discomforts and risks: She understands that most patients have surgery with little difficulty, but problems can happen ranging from minor to fatal. These include nausea, vomiting, pain, bleeding, infection, poor healing, hernia, or formation of adhesions. Unexpected reactions may occur from any drug or anesthetic given. Unintended injury may occur to other pelvic or abdominal structures such as Fallopian tubes, ovaries, bladder, ureter (tube from kidney to bladder), or bowel. Nerves going from the pelvis to the legs may be injured. Any such injury may require immediate or later additional surgery to  correct the problem. Excessive blood loss requiring transfusion is very unlikely but possible. Dangerous blood clots may form in the legs or lungs. Physical and sexual activity will be restricted in varying degrees for an indeterminate period of time but most often 2-6 weeks.  Finally, she understands that it is impossible to list every possible undesirable effect and that the condition for which surgery is done is not always cured or significantly improved, and in rare cases may be even worse.Ample time was given to answer all questions.  Barnett Applebaum, MD, [facility], [facility] 01/09/2020  11:05 AM

## 2020-01-09 NOTE — Telephone Encounter (Signed)
Pt was now show for H&P at 9:20. I called to check in.  She adv she forgot about H&P. Said she's been super anxious and thinking about it and it slipped her mind. Rescheduled for 11:00 - she was very apologetic

## 2020-01-09 NOTE — Progress Notes (Signed)
PRE-OPERATIVE HISTORY AND PHYSICAL EXAM  HPI:  Kerry [name] is a 34 y.o. G2P0202 Patient's last menstrual period was 12/13/2019.; she is being admitted for surgery related to abnormal uterine bleeding and fibroids.  Periods are regular, lasting several days. Dysmenorrhea:moderate, occurring throughout menses. Cyclic symptoms include none. No intermenstrual bleeding, spotting, or discharge.  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  Fibroid have affected previous pregnancies, prior PTD.  Does not want any more pregnancies, and she has thought about this long and hard and also discussed w spouse who is in agreement per pt.  Ultrasound Findings:  The uterus is anteverted and measures 10.8 x 6.1 x 5.3 cm. Echo texture is heterogenous with evidence of focal masses. Within the uterus are multiple suspected fibroids measuring: Fibroid 1:24.3 x 17.6 x 21.0 mm intramural vs. Subserosal fundal Fibroid 2:32.6 x 31.0 x 39.4 mm subserosal left Fibroid 3: 16.4 x 14.6 x 17.4 mm intramural left Fibroid 4: 16.6 x 15.2 x 16.5 mm intramural left Fibroid 5: 20.1 x 21.3 x 15.9 mm subserosal right Fibroid 6: 19.1 x 18.9 x 17.2 mm subserosal right The Endometrium measures 2.3 mm.  PMHx: Past Medical History:  Diagnosis Date  . Anxiety 04/26/2011  . Post partum depression    Past Surgical History:  Procedure Laterality Date  . CERVICAL CERCLAGE N/A 06/30/2016   Procedure: Exam Under Anesthesia;  Surgeon: Mora Bellman, MD;  Location: [facility];  Service: Gynecology;  Laterality: N/A;  . right foot surgery  2014   Family History  Problem Relation Age of Onset  . Hypertension Father   . Gout Father   . Hypertension Mother   . Breast cancer Maternal Aunt 38  . Cancer Maternal Aunt 83       breast  . Hypertension Maternal Grandmother   . Hypertension Maternal Grandfather    Social History   Tobacco Use  . Smoking status: Never Smoker  . Smokeless tobacco: Never Used  Substance Use Topics  .  Alcohol use: No    Comment: rare  . Drug use: No    Current Outpatient Medications:  .  loratadine (CLARITIN) 10 MG tablet, Take 10 mg by mouth daily., Disp: , Rfl:  .  Multiple Vitamin (MULTIVITAMIN) tablet, Take 1 tablet by mouth daily., Disp: , Rfl:  .  OVER THE COUNTER MEDICATION, Take 2 tablets by mouth daily. Goli Gummies, Disp: , Rfl:  .  Norethindrone-Ethinyl Estradiol-Fe Biphas (LO LOESTRIN FE) 1 MG-10 MCG / 10 MCG tablet, Take 1 tablet by mouth daily., Disp: 84 tablet, Rfl: 3 Allergies: Patient has no known allergies.  Review of Systems  Constitutional: Negative for chills, fever and malaise/fatigue.  HENT: Negative for congestion, sinus pain and sore throat.   Eyes: Negative for blurred vision and pain.  Respiratory: Negative for cough and wheezing.   Cardiovascular: Negative for chest pain and leg swelling.  Gastrointestinal: Positive for abdominal pain. Negative for constipation, diarrhea, heartburn, nausea and vomiting.  Genitourinary: Negative for dysuria, frequency, hematuria and urgency.  Musculoskeletal: Negative for back pain, joint pain, myalgias and neck pain.  Skin: Negative for itching and rash.  Neurological: Negative for dizziness, tremors and weakness.  Endo/Heme/Allergies: Does not bruise/bleed easily.  Psychiatric/Behavioral: Negative for depression. The patient is not nervous/anxious and does not have insomnia.     Objective: BP 120/80   Ht 5\' 3"  (1.6 m)   Wt 220 lb (99.8 kg)   LMP 12/13/2019   BMI 38.97 kg/m   Filed  Weights   01/09/20 1103  Weight: 220 lb (99.8 kg)   Physical Exam Constitutional:      General: She is not in acute distress.    Appearance: She is well-developed.  HENT:     Head: Normocephalic and atraumatic. No laceration.     Right Ear: Hearing normal.     Left Ear: Hearing normal.     Mouth/Throat:     Pharynx: Uvula midline.  Eyes:     Pupils: Pupils are equal, round, and reactive to light.  Neck:     Thyroid: No  thyromegaly.  Cardiovascular:     Rate and Rhythm: Normal rate and regular rhythm.     Heart sounds: No murmur heard.  No friction rub. No gallop.   Pulmonary:     Effort: Pulmonary effort is normal. No respiratory distress.     Breath sounds: Normal breath sounds. No wheezing.  Chest:     Breasts:        Right: No mass, skin change or tenderness.        Left: No mass, skin change or tenderness.  Abdominal:     General: Bowel sounds are normal. There is no distension.     Palpations: Abdomen is soft.     Tenderness: There is no abdominal tenderness. There is no rebound.  Musculoskeletal:        General: Normal range of motion.     Cervical back: Normal range of motion and neck supple.  Neurological:     Mental Status: She is alert and oriented to person, place, and time.     Cranial Nerves: No cranial nerve deficit.  Skin:    General: Skin is warm and dry.  Psychiatric:        Judgment: Judgment normal.  Vitals reviewed.    Assessment: 1. Fibroids   2. Menometrorrhagia   Plan TLH BS for her symptoms, as alternatives have been discussed No desire for pregnancy again in the future Consent obtained  I have had a careful discussion with this patient about all the options available and the risk/benefits of each. I have fully informed this patient that surgery may subject her to a variety of discomforts and risks: She understands that most patients have surgery with little difficulty, but problems can happen ranging from minor to fatal. These include nausea, vomiting, pain, bleeding, infection, poor healing, hernia, or formation of adhesions. Unexpected reactions may occur from any drug or anesthetic given. Unintended injury may occur to other pelvic or abdominal structures such as Fallopian tubes, ovaries, bladder, ureter (tube from kidney to bladder), or bowel. Nerves going from the pelvis to the legs may be injured. Any such injury may require immediate or later additional surgery to  correct the problem. Excessive blood loss requiring transfusion is very unlikely but possible. Dangerous blood clots may form in the legs or lungs. Physical and sexual activity will be restricted in varying degrees for an indeterminate period of time but most often 2-6 weeks.  Finally, she understands that it is impossible to list every possible undesirable effect and that the condition for which surgery is done is not always cured or significantly improved, and in rare cases may be even worse.Ample time was given to answer all questions.  Barnett Applebaum, MD, [facility], [facility] 01/09/2020  11:05 AM

## 2020-01-09 NOTE — Progress Notes (Deleted)
PRE-OPERATIVE HISTORY AND PHYSICAL EXAM  HPI:  Kerry [name] is a 34 y.o. G2P0101 No LMP recorded.; she is being admitted for surgery related to abnormal uterine bleeding and fibroids. Periods are regular, lasting several days. Dysmenorrhea:moderate, occurring throughout menses. Cyclic symptoms include none. No intermenstrual bleeding, spotting, or discharge.  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  Fibroid have affected previous pregnancies, prior PTD.  Does not want any more pregnancies, and she has thought about this long and hard and also discussed w spouse who is in agreement per pt.  Ultrasound Findings:  The uterus is anteverted and measures 10.8 x 6.1 x 5.3 cm. Echo texture is heterogenous with evidence of focal masses. Within the uterus are multiple suspected fibroids measuring: Fibroid 1:24.3 x 17.6 x 21.0 mm intramural vs. Subserosal fundal Fibroid 2:32.6 x 31.0 x 39.4 mm subserosal left Fibroid 3: 16.4 x 14.6 x 17.4 mm intramural left Fibroid 4: 16.6 x 15.2 x 16.5 mm intramural left Fibroid 5: 20.1 x 21.3 x 15.9 mm subserosal right Fibroid 6: 19.1 x 18.9 x 17.2 mm subserosal right The Endometrium measures 2.3 mm.  PMHx: Past Medical History:  Diagnosis Date  . Anxiety 04/26/2011  . Post partum depression    Past Surgical History:  Procedure Laterality Date  . CERVICAL CERCLAGE N/A 06/30/2016   Procedure: Exam Under Anesthesia;  Surgeon: Mora Bellman, MD;  Location: [facility];  Service: Gynecology;  Laterality: N/A;  . right foot surgery  2014   Family History  Problem Relation Age of Onset  . Hypertension Father   . Gout Father   . Hypertension Mother   . Breast cancer Maternal Aunt 38  . Cancer Maternal Aunt 23       breast  . Hypertension Maternal Grandmother   . Hypertension Maternal Grandfather    Social History   Tobacco Use  . Smoking status: Never Smoker  . Smokeless tobacco: Never Used  Substance Use Topics  . Alcohol use: No    Comment: rare  .  Drug use: No    Current Outpatient Medications:  .  cephALEXin (KEFLEX) 500 MG capsule, Take 1 capsule (500 mg total) by mouth 2 (two) times daily. (Patient not taking: Reported on 09/24/2019), Disp: 14 capsule, Rfl: 0 .  loratadine (CLARITIN) 10 MG tablet, Take 10 mg by mouth daily., Disp: , Rfl:  .  Multiple Vitamin (MULTIVITAMIN) tablet, Take 1 tablet by mouth daily., Disp: , Rfl:  .  naproxen (NAPROSYN) 500 MG tablet, Take 1 tablet (500 mg total) by mouth 2 (two) times daily with a meal. (Patient not taking: Reported on 09/24/2019), Disp: 20 tablet, Rfl: 2 .  Norethindrone-Ethinyl Estradiol-Fe Biphas (LO LOESTRIN FE) 1 MG-10 MCG / 10 MCG tablet, Take 1 tablet by mouth daily., Disp: 84 tablet, Rfl: 3 .  nystatin cream (MYCOSTATIN), Apply 1 application topically 2 (two) times daily. Apply to outer vaginal irritation only as directed. No internal use (Patient not taking: Reported on 09/24/2019), Disp: 30 g, Rfl: 0 .  OVER THE COUNTER MEDICATION, Take 2 tablets by mouth daily. Goli Gummies, Disp: , Rfl:  .  terconazole (TERAZOL 7) 0.4 % vaginal cream, Place 1 applicator vaginally at bedtime. (Patient not taking: Reported on 09/24/2019), Disp: 45 g, Rfl: 0 Allergies: Patient has no known allergies.  Review of Systems  Constitutional: Negative for chills, fever and malaise/fatigue.  HENT: Negative for congestion, sinus pain and sore throat.   Eyes: Negative for blurred vision and pain.  Respiratory: Negative for cough and wheezing.   Cardiovascular: Negative for chest pain and leg swelling.  Gastrointestinal: Negative for abdominal pain, constipation, diarrhea, heartburn, nausea and vomiting.  Genitourinary: Negative for dysuria, frequency, hematuria and urgency.  Musculoskeletal: Negative for back pain, joint pain, myalgias and neck pain.  Skin: Negative for itching and rash.  Neurological: Negative for dizziness, tremors and weakness.  Endo/Heme/Allergies: Does not bruise/bleed easily.    Psychiatric/Behavioral: Negative for depression. The patient is not nervous/anxious and does not have insomnia.     Objective: There were no vitals taken for this visit. There were no vitals filed for this visit. Physical Exam Constitutional:      General: She is not in acute distress.    Appearance: She is well-developed.  HENT:     Head: Normocephalic and atraumatic. No laceration.     Right Ear: Hearing normal.     Left Ear: Hearing normal.     Mouth/Throat:     Pharynx: Uvula midline.  Eyes:     Pupils: Pupils are equal, round, and reactive to light.  Neck:     Thyroid: No thyromegaly.  Cardiovascular:     Rate and Rhythm: Normal rate and regular rhythm.     Heart sounds: No murmur heard.  No friction rub. No gallop.   Pulmonary:     Effort: Pulmonary effort is normal. No respiratory distress.     Breath sounds: Normal breath sounds. No wheezing.  Chest:     Breasts:        Right: No mass, skin change or tenderness.        Left: No mass, skin change or tenderness.  Abdominal:     General: Bowel sounds are normal. There is no distension.     Palpations: Abdomen is soft.     Tenderness: There is no abdominal tenderness. There is no rebound.  Musculoskeletal:        General: Normal range of motion.     Cervical back: Normal range of motion and neck supple.  Neurological:     Mental Status: She is alert and oriented to person, place, and time.     Cranial Nerves: No cranial nerve deficit.  Skin:    General: Skin is warm and dry.  Psychiatric:        Judgment: Judgment normal.  Vitals reviewed.     Assessment: 1. Fibroids   2. Menometrorrhagia   Discussed options and plan TLH BS  I have had a careful discussion with this patient about all the options available and the risk/benefits of each. I have fully informed this patient that surgery may subject her to a variety of discomforts and risks: She understands that most patients have surgery with little difficulty,  but problems can happen ranging from minor to fatal. These include nausea, vomiting, pain, bleeding, infection, poor healing, hernia, or formation of adhesions. Unexpected reactions may occur from any drug or anesthetic given. Unintended injury may occur to other pelvic or abdominal structures such as Fallopian tubes, ovaries, bladder, ureter (tube from kidney to bladder), or bowel. Nerves going from the pelvis to the legs may be injured. Any such injury may require immediate or later additional surgery to correct the problem. Excessive blood loss requiring transfusion is very unlikely but possible. Dangerous blood clots may form in the legs or lungs. Physical and sexual activity will be restricted in varying degrees for an indeterminate period of time but most often 2-6 weeks.  Finally, she understands that it is  impossible to list every possible undesirable effect and that the condition for which surgery is done is not always cured or significantly improved, and in rare cases may be even worse.Ample time was given to answer all questions.  Barnett Applebaum, MD, [facility], [facility] 01/09/2020  8:09 AM

## 2020-01-13 ENCOUNTER — Encounter
Admission: RE | Admit: 2020-01-13 | Discharge: 2020-01-13 | Disposition: A | Discharge status: Home / Self Care | Payer: Managed Care, Other (non HMO) | Source: Ambulatory Visit | Attending: Obstetrics & Gynecology | Admitting: Obstetrics & Gynecology

## 2020-01-13 ENCOUNTER — Other Ambulatory Visit: Payer: Self-pay

## 2020-01-13 NOTE — Patient Instructions (Signed)
COVID TESTING Date: January 16, 2020 Testing site:  [facility] [address] Thru Hours:  1:85 am - 1:00 pm Once you are tested, you are asked to stay quarantined (avoiding public places) until after your surgery.   Your procedure is scheduled on: January 20, 2020 Report to Day Surgery on the 2nd floor of the [facility]. To find out your arrival time, please call [phone] between 1PM - 3PM on: January 19, 2020  REMEMBER: Instructions that are not followed completely may result in serious medical risk, up to and including death; or upon the discretion of your surgeon and anesthesiologist your surgery may need to be rescheduled.  Do not eat food after midnight the night before surgery.  No gum chewing, lozengers or hard candies.  You may however, drink CLEAR liquids up to 2 hours before you are scheduled to arrive for your surgery. Do not drink anything within 2 hours of your scheduled arrival time.  Clear liquids include: - water  - apple juice without pulp - gatorade (not RED) - black coffee or tea (Do NOT add milk or creamers to the coffee or tea) Do NOT drink anything that is not on this list.  Type 1 and Type 2 diabetics should only drink water.    TAKE THESE MEDICATIONS THE MORNING OF SURGERY WITH A SIP OF WATER: loratadine  Stop Anti-inflammatories (NSAIDS) such as Advil, Aleve, Ibuprofen, Motrin, Naproxen, Naprosyn and aspirin or Aspirin based products such as Excedrin, Goodys Powder, BC Powder. (May take Tylenol or Acetaminophen if needed.)  Stop ANY OVER THE COUNTER supplements until after surgery. (May continue Vitamin D, Vitamin B, and multivitamin.)  No Alcohol for 24 hours before or after surgery.  No Smoking including e-cigarettes for 24 hours prior to surgery.  No chewable tobacco products for at least 6 hours prior to surgery.  No nicotine patches on the day of surgery.  Do not use any "recreational" drugs for at least a week  prior to your surgery.  Please be advised that the combination of cocaine and anesthesia may have negative outcomes, up to and including death. If you test positive for cocaine, your surgery will be cancelled.  On the morning of surgery brush your teeth with toothpaste and water, you may rinse your mouth with mouthwash if you wish. Do not swallow any toothpaste or mouthwash.  Do not wear jewelry, make-up, hairpins, clips or nail polish.  Do not wear lotions, powders, or perfumes or deodorant   Do not shave 48 hours prior to surgery.   Contact lenses, hearing aids and dentures may not be worn into surgery.  Do not bring valuables to the hospital. [facility] is not responsible for any missing/lost belongings or valuables.   Use CHG Soap as directed on instruction sheet.  Notify your doctor if there is any change in your medical condition (cold, fever, infection).  Wear comfortable clothing (specific to your surgery type) to the hospital.  Plan for stool softeners for home use; pain medications have a tendency to cause constipation. You can also help prevent constipation by eating foods high in fiber such as fruits and vegetables and drinking plenty of fluids as your diet allows.  After surgery, you can help prevent lung complications by doing breathing exercises.  Take deep breaths and cough every 1-2 hours. Your doctor may order a device called an Incentive Spirometer to help you take deep breaths. When coughing or sneezing, hold a pillow firmly against your  incision with both hands. This is called "splinting." Doing this helps protect your incision. It also decreases belly discomfort.  If you are being discharged the day of surgery, you will not be allowed to drive home. You will need a responsible adult (18 years or older) to drive you home and stay with you that night.   If you are taking public transportation, you will need to have a responsible adult (18 years or older) with  you. Please confirm with your physician that it is acceptable to use public transportation.   Please call the [facility] at [phone] if you have any questions about these instructions.  Visitation Policy:  Patients undergoing a surgery or procedure may have one family member or support person with them as long as that person is not COVID-19 positive or experiencing its symptoms.  That person may remain in the waiting area during the procedure.  Children under 62 years of age may have both parents or legal guardians with them during their procedure.  Inpatient Visitation Update:   Two designated support people may visit a patient during visiting hours 7 am to 8 pm. It must be the same two designated people for the duration of the patient stay. The visitors may come and go during the day, and there is no switching out to have different visitors. A mask must be worn at all times, including in the patient room.  Children under 39 years of age:  a total of 4 designated visitors for the child's entire stay are allowed. Only 2 in the room at a time and only one staying overnight at a time. The overnight guest can now rotate during the child's hospital stay.  As a reminder, masks are still required for all [facility] team members, patients and visitors in all [facility] facilities.   Systemwide, no visitors 17 or younger.

## 2020-01-16 ENCOUNTER — Telehealth: Payer: Self-pay

## 2020-01-16 ENCOUNTER — Other Ambulatory Visit
Admission: RE | Admit: 2020-01-16 | Discharge: 2020-01-16 | Disposition: A | Discharge status: Home / Self Care | Payer: Managed Care, Other (non HMO) | Source: Ambulatory Visit | Attending: Obstetrics & Gynecology | Admitting: Obstetrics & Gynecology

## 2020-01-16 ENCOUNTER — Other Ambulatory Visit: Payer: Self-pay | Admitting: Obstetrics & Gynecology

## 2020-01-16 ENCOUNTER — Other Ambulatory Visit: Payer: Self-pay

## 2020-01-16 DIAGNOSIS — Z20822 Contact with and (suspected) exposure to covid-19: Secondary | ICD-10-CM | POA: Insufficient documentation

## 2020-01-16 DIAGNOSIS — Z01812 Encounter for preprocedural laboratory examination: Secondary | ICD-10-CM | POA: Insufficient documentation

## 2020-01-16 LAB — CBC
HCT: 36.2 % (ref 36.0–46.0)
Hemoglobin: 12.7 g/dL (ref 12.0–15.0)
MCH: 32.4 pg (ref 26.0–34.0)
MCHC: 35.1 g/dL (ref 30.0–36.0)
MCV: 92.3 fL (ref 80.0–100.0)
Platelets: 346 10*3/uL (ref 150–400)
RBC: 3.92 MIL/uL (ref 3.87–5.11)
RDW: 12.1 % (ref 11.5–15.5)
WBC: 6.6 10*3/uL (ref 4.0–10.5)
nRBC: 0 % (ref 0.0–0.2)

## 2020-01-16 LAB — TYPE AND SCREEN
ABO/RH(D): O POS
Antibody Screen: NEGATIVE

## 2020-01-16 MED ORDER — ALPRAZOLAM ER 0.5 MG PO TB24
0.5000 mg | ORAL_TABLET | ORAL | 0 refills | Status: DC | PRN
Start: 2020-01-16 — End: 2020-11-12

## 2020-01-16 NOTE — Telephone Encounter (Signed)
Pt aware.

## 2020-01-16 NOTE — Telephone Encounter (Signed)
Pt calling; was told rx for xanax would be sent in; pt needs to know when it will be sent in b/c she is now in quarantine and needs to have someone else p/u for her.  [phone]

## 2020-01-17 LAB — SARS CORONAVIRUS 2 (TAT 6-24 HRS): SARS Coronavirus 2: NEGATIVE

## 2020-01-20 ENCOUNTER — Ambulatory Visit: Payer: Managed Care, Other (non HMO) | Admitting: Certified Registered"

## 2020-01-20 ENCOUNTER — Encounter
Admission: RE | Disposition: A | Discharge status: Home / Self Care | Payer: Self-pay | Source: Home / Self Care | Attending: Obstetrics & Gynecology

## 2020-01-20 ENCOUNTER — Encounter: Payer: Self-pay | Admitting: Obstetrics & Gynecology

## 2020-01-20 ENCOUNTER — Ambulatory Visit
Admission: RE | Admit: 2020-01-20 | Discharge: 2020-01-20 | Disposition: A | Discharge status: Home / Self Care | Payer: Managed Care, Other (non HMO) | Attending: Obstetrics & Gynecology | Admitting: Obstetrics & Gynecology

## 2020-01-20 ENCOUNTER — Other Ambulatory Visit: Payer: Self-pay

## 2020-01-20 DIAGNOSIS — N921 Excessive and frequent menstruation with irregular cycle: Secondary | ICD-10-CM

## 2020-01-20 DIAGNOSIS — Z79899 Other long term (current) drug therapy: Secondary | ICD-10-CM | POA: Diagnosis not present

## 2020-01-20 DIAGNOSIS — Z6838 Body mass index (BMI) 38.0-38.9, adult: Secondary | ICD-10-CM | POA: Diagnosis not present

## 2020-01-20 DIAGNOSIS — D219 Benign neoplasm of connective and other soft tissue, unspecified: Secondary | ICD-10-CM

## 2020-01-20 DIAGNOSIS — F329 Major depressive disorder, single episode, unspecified: Secondary | ICD-10-CM | POA: Diagnosis not present

## 2020-01-20 DIAGNOSIS — N946 Dysmenorrhea, unspecified: Secondary | ICD-10-CM | POA: Insufficient documentation

## 2020-01-20 DIAGNOSIS — E669 Obesity, unspecified: Secondary | ICD-10-CM | POA: Diagnosis not present

## 2020-01-20 DIAGNOSIS — Z8349 Family history of other endocrine, nutritional and metabolic diseases: Secondary | ICD-10-CM | POA: Insufficient documentation

## 2020-01-20 DIAGNOSIS — F419 Anxiety disorder, unspecified: Secondary | ICD-10-CM | POA: Insufficient documentation

## 2020-01-20 DIAGNOSIS — D259 Leiomyoma of uterus, unspecified: Secondary | ICD-10-CM | POA: Insufficient documentation

## 2020-01-20 DIAGNOSIS — Z8249 Family history of ischemic heart disease and other diseases of the circulatory system: Secondary | ICD-10-CM | POA: Diagnosis not present

## 2020-01-20 DIAGNOSIS — Z803 Family history of malignant neoplasm of breast: Secondary | ICD-10-CM | POA: Diagnosis not present

## 2020-01-20 HISTORY — PX: TOTAL LAPAROSCOPIC HYSTERECTOMY WITH SALPINGECTOMY: SHX6742

## 2020-01-20 HISTORY — PX: CYSTOSCOPY: SHX5120

## 2020-01-20 LAB — POCT PREGNANCY, URINE: Preg Test, Ur: NEGATIVE

## 2020-01-20 SURGERY — HYSTERECTOMY, TOTAL, LAPAROSCOPIC, WITH SALPINGECTOMY
Anesthesia: General | Site: Urethra

## 2020-01-20 MED ORDER — MIDAZOLAM HCL 2 MG/2ML IJ SOLN
INTRAMUSCULAR | Status: DC | PRN
  Administered 2020-01-20: 2 mg via INTRAVENOUS

## 2020-01-20 MED ORDER — LACTATED RINGERS IV SOLN: INTRAVENOUS | Status: DC

## 2020-01-20 MED ORDER — FAMOTIDINE 20 MG PO TABS: 20.0000 mg | ORAL_TABLET | Freq: Once | ORAL | Status: AC

## 2020-01-20 MED ORDER — ACETAMINOPHEN 325 MG PO TABS: 650.0000 mg | ORAL_TABLET | ORAL | Status: DC | PRN

## 2020-01-20 MED ORDER — ACETAMINOPHEN 10 MG/ML IV SOLN
INTRAVENOUS | Status: DC | PRN
  Administered 2020-01-20: 1000 mg via INTRAVENOUS

## 2020-01-20 MED ORDER — SODIUM CHLORIDE 0.9 % IR SOLN
Status: DC | PRN
  Administered 2020-01-20: 1000 mL

## 2020-01-20 MED ORDER — FENTANYL CITRATE (PF) 100 MCG/2ML IJ SOLN
25.0000 ug | INTRAMUSCULAR | Status: AC | PRN
  Administered 2020-01-20 (×6): 25 ug via INTRAVENOUS

## 2020-01-20 MED ORDER — PROPOFOL 500 MG/50ML IV EMUL
INTRAVENOUS | Status: DC | PRN
  Administered 2020-01-20: 150 ug/kg/min via INTRAVENOUS

## 2020-01-20 MED ORDER — ONDANSETRON HCL 4 MG/2ML IJ SOLN
INTRAMUSCULAR | Status: DC | PRN
  Administered 2020-01-20: 4 mg via INTRAVENOUS

## 2020-01-20 MED ORDER — ACETAMINOPHEN 650 MG RE SUPP
650.0000 mg | RECTAL | Status: DC | PRN
  Filled 2020-01-20: qty 1

## 2020-01-20 MED ORDER — SUGAMMADEX SODIUM 200 MG/2ML IV SOLN
INTRAVENOUS | Status: DC | PRN
  Administered 2020-01-20: 200 mg via INTRAVENOUS

## 2020-01-20 MED ORDER — MIDAZOLAM HCL 2 MG/2ML IJ SOLN
INTRAMUSCULAR | Status: AC
  Filled 2020-01-20: qty 2

## 2020-01-20 MED ORDER — FENTANYL CITRATE (PF) 100 MCG/2ML IJ SOLN
INTRAMUSCULAR | Status: AC
  Filled 2020-01-20: qty 2

## 2020-01-20 MED ORDER — DEXAMETHASONE SODIUM PHOSPHATE 10 MG/ML IJ SOLN
INTRAMUSCULAR | Status: DC | PRN
  Administered 2020-01-20: 5 mg via INTRAVENOUS

## 2020-01-20 MED ORDER — PROPOFOL 10 MG/ML IV BOLUS
INTRAVENOUS | Status: DC | PRN
  Administered 2020-01-20: 200 mg via INTRAVENOUS

## 2020-01-20 MED ORDER — OXYCODONE HCL 5 MG/5ML PO SOLN: 5.0000 mg | Freq: Once | ORAL | Status: DC | PRN

## 2020-01-20 MED ORDER — OXYCODONE HCL 5 MG PO TABS
ORAL_TABLET | ORAL | Status: AC
  Administered 2020-01-20: 5 mg
  Filled 2020-01-20: qty 1

## 2020-01-20 MED ORDER — OXYCODONE-ACETAMINOPHEN 5-325 MG PO TABS: 1.0000 | ORAL_TABLET | ORAL | Status: DC | PRN

## 2020-01-20 MED ORDER — FENTANYL CITRATE (PF) 100 MCG/2ML IJ SOLN
INTRAMUSCULAR | Status: DC | PRN
  Administered 2020-01-20 (×5): 50 ug via INTRAVENOUS

## 2020-01-20 MED ORDER — CHLORHEXIDINE GLUCONATE 0.12 % MT SOLN
15.0000 mL | Freq: Once | OROMUCOSAL | Status: AC
  Administered 2020-01-20: 15 mL via OROMUCOSAL

## 2020-01-20 MED ORDER — HEMOSTATIC AGENTS (NO CHARGE) OPTIME
TOPICAL | Status: DC | PRN
  Administered 2020-01-20: 1 via TOPICAL

## 2020-01-20 MED ORDER — BUPIVACAINE HCL (PF) 0.5 % IJ SOLN
INTRAMUSCULAR | Status: DC | PRN
  Administered 2020-01-20: 11 mL

## 2020-01-20 MED ORDER — ONDANSETRON HCL 4 MG/2ML IJ SOLN: 4.0000 mg | Freq: Once | INTRAMUSCULAR | Status: DC | PRN

## 2020-01-20 MED ORDER — FAMOTIDINE 20 MG PO TABS
ORAL_TABLET | ORAL | Status: AC
  Administered 2020-01-20: 20 mg via ORAL
  Filled 2020-01-20: qty 1

## 2020-01-20 MED ORDER — OXYCODONE HCL 5 MG PO TABS: 5.0000 mg | ORAL_TABLET | Freq: Once | ORAL | Status: DC | PRN

## 2020-01-20 MED ORDER — FENTANYL CITRATE (PF) 250 MCG/5ML IJ SOLN
INTRAMUSCULAR | Status: AC
  Filled 2020-01-20: qty 5

## 2020-01-20 MED ORDER — PROPOFOL 10 MG/ML IV BOLUS
INTRAVENOUS | Status: AC
  Filled 2020-01-20: qty 40

## 2020-01-20 MED ORDER — KETOROLAC TROMETHAMINE 30 MG/ML IJ SOLN
30.0000 mg | Freq: Once | INTRAMUSCULAR | Status: AC
  Administered 2020-01-20: 30 mg via INTRAVENOUS

## 2020-01-20 MED ORDER — KETOROLAC TROMETHAMINE 30 MG/ML IJ SOLN
INTRAMUSCULAR | Status: AC
  Filled 2020-01-20: qty 1

## 2020-01-20 MED ORDER — POVIDONE-IODINE 10 % EX SWAB: 2.0000 "application " | Freq: Once | CUTANEOUS | Status: DC

## 2020-01-20 MED ORDER — DEXAMETHASONE SODIUM PHOSPHATE 10 MG/ML IJ SOLN
INTRAMUSCULAR | Status: AC
  Filled 2020-01-20: qty 1

## 2020-01-20 MED ORDER — ORAL CARE MOUTH RINSE: 15.0000 mL | Freq: Once | OROMUCOSAL | Status: AC

## 2020-01-20 MED ORDER — ROCURONIUM BROMIDE 100 MG/10ML IV SOLN
INTRAVENOUS | Status: DC | PRN
  Administered 2020-01-20: 10 mg via INTRAVENOUS
  Administered 2020-01-20: 60 mg via INTRAVENOUS

## 2020-01-20 MED ORDER — ONDANSETRON HCL 4 MG/2ML IJ SOLN
INTRAMUSCULAR | Status: AC
  Filled 2020-01-20: qty 2

## 2020-01-20 MED ORDER — CHLORHEXIDINE GLUCONATE 0.12 % MT SOLN
OROMUCOSAL | Status: AC
  Filled 2020-01-20: qty 15

## 2020-01-20 MED ORDER — MORPHINE SULFATE (PF) 2 MG/ML IV SOLN: 1.0000 mg | INTRAVENOUS | Status: DC | PRN

## 2020-01-20 MED ORDER — OXYCODONE-ACETAMINOPHEN 5-325 MG PO TABS: 1.0000 | ORAL_TABLET | ORAL | 0 refills | Status: DC | PRN

## 2020-01-20 MED ORDER — SODIUM CHLORIDE 0.9 % IV SOLN
2.0000 g | INTRAVENOUS | Status: AC
  Administered 2020-01-20: 2 g via INTRAVENOUS

## 2020-01-20 MED ORDER — PHENYLEPHRINE HCL (PRESSORS) 10 MG/ML IV SOLN
INTRAVENOUS | Status: AC
  Filled 2020-01-20: qty 1

## 2020-01-20 MED ORDER — 0.9 % SODIUM CHLORIDE (POUR BTL) OPTIME
TOPICAL | Status: DC | PRN
  Administered 2020-01-20: 500 mL

## 2020-01-20 MED ORDER — LIDOCAINE HCL (PF) 2 % IJ SOLN
INTRAMUSCULAR | Status: AC
  Filled 2020-01-20: qty 5

## 2020-01-20 MED ORDER — LIDOCAINE HCL (CARDIAC) PF 100 MG/5ML IV SOSY
PREFILLED_SYRINGE | INTRAVENOUS | Status: DC | PRN
  Administered 2020-01-20: 100 mg via INTRAVENOUS

## 2020-01-20 MED ORDER — SODIUM CHLORIDE 0.9 % IV SOLN
INTRAVENOUS | Status: AC
  Filled 2020-01-20: qty 2

## 2020-01-20 MED ORDER — ROCURONIUM BROMIDE 10 MG/ML (PF) SYRINGE
PREFILLED_SYRINGE | INTRAVENOUS | Status: AC
  Filled 2020-01-20: qty 10

## 2020-01-20 SURGICAL SUPPLY — 54 items
APPLICATOR ARISTA FLEXITIP XL (MISCELLANEOUS) ×4 IMPLANT
BAG URINE DRAIN 2000ML AR STRL (UROLOGICAL SUPPLIES) ×4 IMPLANT
BLADE SURG SZ11 CARB STEEL (BLADE) ×4 IMPLANT
CANISTER SUCT 1200ML W/VALVE (MISCELLANEOUS) ×4 IMPLANT
CATH FOLEY 2WAY  5CC 16FR (CATHETERS) ×2
CATH URTH 16FR FL 2W BLN LF (CATHETERS) ×2 IMPLANT
CHLORAPREP W/TINT 26 (MISCELLANEOUS) ×4 IMPLANT
COVER WAND RF STERILE (DRAPES) ×4 IMPLANT
DEFOGGER SCOPE WARMER CLEARIFY (MISCELLANEOUS) ×4 IMPLANT
DERMABOND ADVANCED (GAUZE/BANDAGES/DRESSINGS) ×2
DERMABOND ADVANCED .7 DNX12 (GAUZE/BANDAGES/DRESSINGS) ×2 IMPLANT
DEVICE SUTURE ENDOST 10MM (ENDOMECHANICALS) ×4 IMPLANT
DRAPE CAMERA CLOSED 9X96 (DRAPES) ×4 IMPLANT
DRSG TEGADERM 2-3/8X2-3/4 SM (GAUZE/BANDAGES/DRESSINGS) IMPLANT
GAUZE 4X4 16PLY RFD (DISPOSABLE) ×4 IMPLANT
GLOVE BIO SURGEON STRL SZ8 (GLOVE) ×24 IMPLANT
GLOVE INDICATOR 8.0 STRL GRN (GLOVE) ×8 IMPLANT
GOWN STRL REUS W/ TWL LRG LVL3 (GOWN DISPOSABLE) ×8 IMPLANT
GOWN STRL REUS W/ TWL XL LVL3 (GOWN DISPOSABLE) ×4 IMPLANT
GOWN STRL REUS W/TWL LRG LVL3 (GOWN DISPOSABLE) ×8
GOWN STRL REUS W/TWL XL LVL3 (GOWN DISPOSABLE) ×4
GRASPER SUT TROCAR 14GX15 (MISCELLANEOUS) ×4 IMPLANT
HEMOSTAT ARISTA ABSORB 1G (HEMOSTASIS) ×4 IMPLANT
IRRIGATION STRYKERFLOW (MISCELLANEOUS) ×2 IMPLANT
IRRIGATOR STRYKERFLOW (MISCELLANEOUS) ×4
IV LACTATED RINGERS 1000ML (IV SOLUTION) ×8 IMPLANT
KIT PINK PAD W/HEAD ARE REST (MISCELLANEOUS) ×4
KIT PINK PAD W/HEAD ARM REST (MISCELLANEOUS) ×2 IMPLANT
KIT TURNOVER CYSTO (KITS) ×4 IMPLANT
LABEL OR SOLS (LABEL) ×4 IMPLANT
MANIPULATOR VCARE LG CRV RETR (MISCELLANEOUS) IMPLANT
MANIPULATOR VCARE SML CRV RETR (MISCELLANEOUS) IMPLANT
MANIPULATOR VCARE STD CRV RETR (MISCELLANEOUS) ×4 IMPLANT
NEEDLE VERESS 14GA 120MM (NEEDLE) ×4 IMPLANT
NS IRRIG 500ML POUR BTL (IV SOLUTION) ×4 IMPLANT
OCCLUDER COLPOPNEUMO (BALLOONS) ×4 IMPLANT
PACK GYN LAPAROSCOPIC (MISCELLANEOUS) ×4 IMPLANT
PAD OB MATERNITY 4.3X12.25 (PERSONAL CARE ITEMS) ×4 IMPLANT
PAD PREP 24X41 OB/GYN DISP (PERSONAL CARE ITEMS) ×4 IMPLANT
PORT ACCESS TROCAR AIRSEAL 12 (TROCAR) ×2 IMPLANT
PORT ACCESS TROCAR AIRSEAL 5M (TROCAR) ×2
SCISSORS METZENBAUM CVD 33 (INSTRUMENTS) ×4 IMPLANT
SET CYSTO W/LG BORE CLAMP LF (SET/KITS/TRAYS/PACK) ×4 IMPLANT
SET TRI-LUMEN FLTR TB AIRSEAL (TUBING) ×4 IMPLANT
SHEARS HARMONIC ACE PLUS 36CM (ENDOMECHANICALS) ×4 IMPLANT
SLEEVE ENDOPATH XCEL 5M (ENDOMECHANICALS) ×4 IMPLANT
SPONGE GAUZE 2X2 8PLY STER LF (GAUZE/BANDAGES/DRESSINGS)
SPONGE GAUZE 2X2 8PLY STRL LF (GAUZE/BANDAGES/DRESSINGS) IMPLANT
SUT ENDO VLOC 180-0-8IN (SUTURE) ×4 IMPLANT
SUT VIC AB 0 CT1 36 (SUTURE) ×4 IMPLANT
SUT VIC AB 4-0 FS2 27 (SUTURE) ×4 IMPLANT
SYR 10ML LL (SYRINGE) ×4 IMPLANT
SYR 50ML LL SCALE MARK (SYRINGE) ×4 IMPLANT
TROCAR XCEL NON-BLD 5MMX100MML (ENDOMECHANICALS) ×4 IMPLANT

## 2020-01-20 NOTE — Anesthesia Preprocedure Evaluation (Addendum)
Anesthesia Evaluation  Patient identified by MRN, date of birth, ID band Patient awake    Reviewed: Allergy & Precautions, H&P , NPO status , Patient's Chart, lab work & pertinent test results  History of Anesthesia Complications (+) PONV  Airway Mallampati: II  TM Distance: >3 FB Neck ROM: full    Dental  (+) Teeth Intact   Pulmonary neg pulmonary ROS, neg COPD,    breath sounds clear to auscultation       Cardiovascular (-) angina(-) Past MI negative cardio ROS  (-) dysrhythmias  Rhythm:regular Rate:Normal     Neuro/Psych PSYCHIATRIC DISORDERS Anxiety Depression negative neurological ROS     GI/Hepatic negative GI ROS, Neg liver ROS,   Endo/Other  negative endocrine ROS  Renal/GU      Musculoskeletal   Abdominal   Peds  Hematology negative hematology ROS (+)   Anesthesia Other Findings Obese  Past Medical History: 04/26/2011: Anxiety No date: Post partum depression  Past Surgical History: 06/30/2016: CERVICAL CERCLAGE; N/A     Comment:  Procedure: Exam Under Anesthesia;  Surgeon: Mora Bellman, MD;  Location: [facility];  Service: Gynecology;                Laterality: N/A; 2018: CESAREAN SECTION 2014: right foot surgery     Reproductive/Obstetrics negative OB ROS                           Anesthesia Physical Anesthesia Plan  ASA: II  Anesthesia Plan: General ETT   Post-op Pain Management:    Induction:   PONV Risk Score and Plan: 4 or greater and Ondansetron, Dexamethasone, Midazolam, Treatment may vary due to age or medical condition, Propofol infusion and TIVA  Airway Management Planned:   Additional Equipment:   Intra-op Plan:   Post-operative Plan:   Informed Consent: I have reviewed the patients History and Physical, chart, labs and discussed the procedure including the risks, benefits and alternatives for the proposed anesthesia with the  patient or authorized representative who has indicated his/her understanding and acceptance.     Dental Advisory Given  Plan Discussed with: Anesthesiologist, CRNA and Surgeon  Anesthesia Plan Comments:        Anesthesia Quick Evaluation

## 2020-01-20 NOTE — Op Note (Signed)
Operative Report:  PRE-OP DIAGNOSIS: Fibroids D21.9 Menometrorrhagia N92.1   POST-OP DIAGNOSIS: Fibroids D21.9 Menometrorrhagia N92.1   PROCEDURE: Procedure(s): TOTAL LAPAROSCOPIC HYSTERECTOMY WITH BILATERAL SALPINGECTOMY CYSTOSCOPY  SURGEON: Barnett Applebaum, MD, FACOG  ASSISTANT: Dr Glennon Mac, No other capable assistant available, in surgery requiring high level assistant.  ANESTHESIA: General endotracheal anesthesia  ESTIMATED BLOOD LOSS: less than 50   SPECIMENS: Uterus, Tubes.  COMPLICATIONS: None  DISPOSITION: stable to PACU  FINDINGS: Intraabdominal adhesions were noted. Adhesions filmy along lower uterine segment to anterior abdominal wall and overlying left adnexa.  PROCEDURE:  The patient was taken to the OR where anesthesia was administed. She was prepped and draped in the normal sterile fashion in the dorsal lithotomy position in the [place] stirrups. A time out was performed. A Graves speculum was inserted, the cervix was grasped with a single tooth tenaculum and the endometrial cavity was sounded. The cervix was progressively dilated to a size 18 [place] with [employer] dilators. A V-Care uterine manipulator was inserted in the usual fashion without incident. Gloves were changed and attention was turned to the abdomen.   An infraumbilical transverse 83mm skin incision was made with the scalpel after local anesthesia applied to the skin. A Veress-step needle was inserted in the usual fashion and confirmed using the hanging drop technique. A pneumoperitoneum was obtained by insufflation of CO2 (opening pressure of 65mmHg) to 84mmHg. A diagnostic laparoscopy was performed yielding the previously described findings. Attention was turned to the left lower quadrant where after visualization of the inferior epigastric vessels a 25mm skin incision was made with the scalpel. A 5 mm laparoscopic port was inserted. The same procedure was repeated in the right lower quadrant with a 75mm trocar.   Adhesions were dissected free to better visualize the uterus and adnexa and to successfully perform the procedure.   Attention was turned to the left aspect of the uterus, where after visualization of the ureter, the round ligament was coagulated and transected using the 54mm Harmonic Scapel. The anterior and posterior leafs of the broad ligament were dissected off as the anterior one was coagulated and transected in a caudal direction towards the cuff of the uterine manipulator.  Attention was then turned to the left fallopian tube which was recognized by visualization of the fimbria. The tube is excised to its attachment to the uterus. The uterine-ovarian ligament and its blood vessels were carefully coagulated and transected using the Harmonic scapel.  Attention was turned to the right aspect of the uterus where the same procedure was performed.  The vesicouterine reflection of the peritoneum was dissected with the harmonic scapel and the bladder flap was created bluntly.  The uterine vessels were coagulated and transected bilaterally using first bipolar cautery and then the harmonic scapel. A 360 degree, circumferential colpotomy was done to completely amputate the uterus with cervix and tubes. Once the specimen was amputated it was delivered through the vagina.   The colpotomy was repaired in a simple running fashion using a delayed absorbable suture with an endo-stitch device.  Vaginal exam confirms complete closure.  The cavity was copiously irrigated. A survey of the pelvic cavity revealed adequate hemostasis and no injury to bowel, bladder, or ureter.   A diagnostic cystoscopy was performed using saline distension of bladder with no lesions or injuries noted.  Bilateral urine flow from each ureteral orifice is visualized.  At this point the procedure was finalized. Right lower quadrant fascia incision is closed with a vicryl suture using the fascia closure device.  All the instruments were removed from  the patient's body. Gas was expelled and patient is leveled.  Incisions are closed with skin adhesive.  The assistance of my assisting-physician was vital to resect and retract interchangably with self on each side.  Patient goes to recovery room in stable condition.  All sponge, instrument, and needle counts are correct x2.     Barnett Applebaum, MD, [facility], [facility] 01/20/2020  12:53 PM

## 2020-01-20 NOTE — Discharge Instructions (Signed)
Total Laparoscopic Hysterectomy, Care After This sheet gives you information about how to care for yourself after your procedure. Your health care provider may also give you more specific instructions. If you have problems or questions, contact your health care provider. What can I expect after the procedure? After the procedure, it is common to have:  Pain and bruising around your incisions.  A sore throat, if a breathing tube was used during surgery.  Fatigue.  Poor appetite.  Less interest in sex. If your ovaries were also removed, it is also common to have symptoms of menopause such as hot flashes, night sweats, and lack of sleep (insomnia). Follow these instructions at home: Bathing  Do not take baths, swim, or use a hot tub until your health care provider approves. You may need to only take showers for 2-3 weeks.  Keep your bandage (dressing) dry until your health care provider says it can be removed. Incision care   Follow instructions from your health care provider about how to take care of your incisions. Make sure you: ? Wash your hands with soap and water before you change your dressing. If soap and water are not available, use hand sanitizer. ? Change your dressing as told by your health care provider. ? Leave stitches (sutures), skin glue, or adhesive strips in place. These skin closures may need to stay in place for 2 weeks or longer. If adhesive strip edges start to loosen and curl up, you may trim the loose edges. Do not remove adhesive strips completely unless your health care provider tells you to do that.  Check your incision area every day for signs of infection. Check for: ? Redness, swelling, or pain. ? Fluid or blood. ? Warmth. ? Pus or a bad smell. Activity  Get plenty of rest and sleep.  Do not lift anything that is heavier than 10 lbs (4.5 kg) for one month after surgery, or as long as told by your health care provider.  Do not drive or use heavy  machinery while taking prescription pain medicine.  Do not drive for 24 hours if you were given a medicine to help you relax (sedative).  Return to your normal activities as told by your health care provider. Ask your health care provider what activities are safe for you. Lifestyle   Do not use any products that contain nicotine or tobacco, such as cigarettes and e-cigarettes. These can delay healing. If you need help quitting, ask your health care provider.  Do not drink alcohol until your health care provider approves. General instructions  Do not douche, use tampons, or have sex for at least 6 weeks, or as told by your health care provider.  Take over-the-counter and prescription medicines only as told by your health care provider.  To monitor yourself for a fever, take your temperature at least once a day during recovery.  If you struggle with physical or emotional changes after your procedure, speak with your health care provider or a therapist.  To prevent or treat constipation while you are taking prescription pain medicine, your health care provider may recommend that you: ? Drink enough fluid to keep your urine clear or pale yellow. ? Take over-the-counter or prescription medicines. ? Eat foods that are high in fiber, such as fresh fruits and vegetables, whole grains, and beans. ? Limit foods that are high in fat and processed sugars, such as fried and sweet foods.  Keep all follow-up visits as told by your health care provider.   This is important. Contact a health care provider if:  You have chills or a fever.  You have redness, swelling, or pain around an incision.  You have fluid or blood coming from an incision.  Your incision feels warm to the touch.  You have pus or a bad smell coming from an incision.  An incision breaks open.  You feel dizzy or light-headed.  You have pain or bleeding when you urinate.  You have diarrhea, nausea, or vomiting that does not  go away.  You have abnormal vaginal discharge.  You have a rash.  You have pain that does not get better with medicine. Get help right away if:  You have a fever and your symptoms suddenly get worse.  You have severe abdominal pain.  You have chest pain.  You have shortness of breath.  You faint.  You have pain, swelling, or redness on your leg.  You have heavy vaginal bleeding with blood clots. Summary  After the procedure it is common to have abdominal pain. Your provider will give you medication for this.  Do not take baths, swim, or use a hot tub until your health care provider approves.  Do not lift anything that is heavier than 10 lbs (4.5 kg) for one month after surgery, or as long as told by your health care provider.  Notify your provider if you have any signs or symptoms of infection after the procedure. This information is not intended to replace advice given to you by your health care provider. Make sure you discuss any questions you have with your health care provider. Document Revised: 06/08/2017 Document Reviewed: 09/06/2016 Elsevier Patient Education  2020 Elsevier Inc.    AMBULATORY SURGERY  DISCHARGE INSTRUCTIONS   1) The drugs that you were given will stay in your system until tomorrow so for the next 24 hours you should not:  A) Drive an automobile B) Make any legal decisions C) Drink any alcoholic beverage   2) You may resume regular meals tomorrow.  Today it is better to start with liquids and gradually work up to solid foods.  You may eat anything you prefer, but it is better to start with liquids, then soup and crackers, and gradually work up to solid foods.   3) Please notify your doctor immediately if you have any unusual bleeding, trouble breathing, redness and pain at the surgery site, drainage, fever, or pain not relieved by medication.    4) Additional Instructions:        Please contact your physician with any problems  or Same Day Surgery at [phone], Monday through Friday 6 am to 4 pm, or [facility] number at [phone]. 

## 2020-01-20 NOTE — Interval H&P Note (Signed)
History and Physical Interval Note:  01/20/2020 9:55 AM  Kerry [name]  has presented today for surgery, with the diagnosis of Fibroids D21.9 Menometrorrhagia N92.1.  The various methods of treatment have been discussed with the patient and family. After consideration of risks, benefits and other options for treatment, the patient has consented to  Procedure(s): TOTAL LAPAROSCOPIC HYSTERECTOMY WITH SALPINGECTOMY (Bilateral) as a surgical intervention.  The patient's history has been reviewed, patient examined, no change in status, stable for surgery.  I have reviewed the patient's chart and labs.  Questions were answered to the patient's satisfaction.     Hoyt Koch

## 2020-01-20 NOTE — Anesthesia Procedure Notes (Signed)
Procedure Name: Intubation Date/Time: 01/20/2020 11:00 AM Performed by: Chanetta Marshall, CRNA Pre-anesthesia Checklist: Patient identified, Emergency Drugs available, Suction available and Patient being monitored Patient Re-evaluated:Patient Re-evaluated prior to induction Oxygen Delivery Method: Circle system utilized Preoxygenation: Pre-oxygenation with 100% oxygen Induction Type: IV induction Ventilation: Mask ventilation without difficulty Laryngoscope Size: McGraph and 3 Grade View: Grade I Tube type: Oral Tube size: 7.5 mm Number of attempts: 1 Airway Equipment and Method: Video-laryngoscopy Placement Confirmation: ETT inserted through vocal cords under direct vision,  positive ETCO2,  breath sounds checked- equal and bilateral and CO2 detector Secured at: 21 cm Tube secured with: Tape Dental Injury: Teeth and Oropharynx as per pre-operative assessment

## 2020-01-20 NOTE — Transfer of Care (Signed)
Immediate Anesthesia Transfer of Care Note  Patient: Kerry [name]  Procedure(s) Performed: TOTAL LAPAROSCOPIC HYSTERECTOMY WITH BILATERAL SALPINGECTOMY (Bilateral Abdomen)  Patient Location: PACU  Anesthesia Type:General  Level of Consciousness: awake, alert  and oriented  Airway & Oxygen Therapy: Patient Spontanous Breathing and Patient connected to nasal cannula oxygen  Post-op Assessment: Report given to RN and Post -op Vital signs reviewed and stable  Post vital signs: Reviewed and stable  Last Vitals:  Vitals Value Taken Time  BP    Temp    Pulse    Resp    SpO2      Last Pain:  Vitals:   01/20/20 0910  TempSrc: Tympanic  PainSc: 0-No pain         Complications: No complications documented.

## 2020-01-21 ENCOUNTER — Telehealth: Payer: Self-pay

## 2020-01-21 ENCOUNTER — Encounter: Payer: Self-pay | Admitting: Obstetrics & Gynecology

## 2020-01-21 NOTE — Anesthesia Postprocedure Evaluation (Signed)
Anesthesia Post Note  Patient: Kerry [name]  Procedure(s) Performed: TOTAL LAPAROSCOPIC HYSTERECTOMY WITH BILATERAL SALPINGECTOMY (Bilateral Abdomen) CYSTOSCOPY (N/A Urethra)  Patient location during evaluation: PACU Anesthesia Type: General Level of consciousness: awake and alert Pain management: pain level controlled Vital Signs Assessment: post-procedure vital signs reviewed and stable Respiratory status: spontaneous breathing, nonlabored ventilation and respiratory function stable Cardiovascular status: blood pressure returned to baseline and stable Postop Assessment: no apparent nausea or vomiting Anesthetic complications: no   No complications documented.   Last Vitals:  Vitals:   01/20/20 1518 01/20/20 1621  BP: 115/74 125/77  Pulse: 95 88  Resp:  16  Temp:    SpO2: 100%     Last Pain:  Vitals:   01/20/20 1500  TempSrc: Temporal  PainSc:                  Tera Mater

## 2020-01-21 NOTE — Telephone Encounter (Signed)
Pt calling; is having postop pain; normal or not?  [phone] pt is having pain esp on one side; is taking percocet q4h and tylenol in between; states c movement pain is 8/10.  Adv to get up and walk/move to help work the soreness out, continue c pain meds, if after a few days doesn't see improvement to call.

## 2020-01-22 LAB — SURGICAL PATHOLOGY

## 2020-01-29 ENCOUNTER — Telehealth: Payer: Self-pay

## 2020-01-29 NOTE — Telephone Encounter (Signed)
Pt calling; is having discomfort; at first it felt like a YI; took the pill for yeast and that took care of it; after that pt started spotting; had hyst - pain from that has gotten worse.  What's going on?  Has appts 7/26 and 8/3.  [phone]  Hyst was July 13th.

## 2020-01-29 NOTE — Telephone Encounter (Signed)
Pt has one area near incision that is really painfull; has painful urination; made appt for her via SP for 9:30 tomorrow c SDJ.

## 2020-01-29 NOTE — Telephone Encounter (Signed)
I don't appear to have any openings today.  If there is a way to get her in before Monday, it might be good to have her seen.  If she has no fever, chills, issues with incision sites. If she is eating, voiding, having at least an occasional bowel movement, she should be OK.  Otherwise, if her spotting is persistent, maybe she should be seen.  Some spotting is OK, as long as everything else is OK.

## 2020-01-30 ENCOUNTER — Other Ambulatory Visit: Payer: Self-pay

## 2020-01-30 ENCOUNTER — Ambulatory Visit (INDEPENDENT_AMBULATORY_CARE_PROVIDER_SITE_OTHER): Payer: Managed Care, Other (non HMO) | Admitting: Obstetrics and Gynecology

## 2020-01-30 ENCOUNTER — Encounter: Payer: Self-pay | Admitting: Obstetrics and Gynecology

## 2020-01-30 VITALS — BP 128/88 | Wt 215.0 lb

## 2020-01-30 DIAGNOSIS — Z09 Encounter for follow-up examination after completed treatment for conditions other than malignant neoplasm: Secondary | ICD-10-CM

## 2020-01-30 NOTE — Progress Notes (Signed)
   Postoperative Follow-up Patient presents post op from TLH/BS/Cysto 10 days ago for abnormal uterine bleeding and fibroids.  Subjective: Patient reports pain in RLQ with getting up and down.  She has had occasional vaginal spotting. Mostly noted when wiping after voiding.  Denies fevers, chills, nausea, vomiting.  Eating a regular diet without difficulty.   Activity: increasing slowly.  Is voiding normally. Has had a bowel movement.  Objective: Vitals:   01/30/20 0948  BP: (!) 128/88   Vital Signs: BP (!) 128/88   Wt (!) 215 lb (97.5 kg)   BMI 38.09 kg/m  Constitutional: Well nourished, well developed female in no acute distress.  HEENT: normal Skin: Warm and dry.  Extremity: no edema  Abdomen: Soft, non-tender, normal bowel sounds; no bruits, organomegaly or masses. clean, dry, intact and no erythema, induration, warmth, and mild ttp in RLQ  Pelvic exam: deferred given the very small amount of spotting and no other concerning symptoms. SHe also has follow up with Dr. Kenton Kingfisher in 3 days.  UA: negative dip   Assessment: 34 y.o. s/p above surgery with some post-op pain. Not necessarily out of range of expected. Discussed significant adhesive disease noted on surgery. Will send UA to verify no infection, given sx.   Plan: Patient has done well after surgery with no apparent complications.  I have discussed the post-operative course to date, and the expected progress moving forward.  The patient understands what complications to be concerned about.  I will see the patient in routine follow up, or sooner if needed.    Reassurance provided. If vaginal bleeding increases, follow up sooner.   Prentice Docker, MD 01/30/2020, 10:11 AM

## 2020-02-01 LAB — URINE CULTURE

## 2020-02-02 ENCOUNTER — Ambulatory Visit (INDEPENDENT_AMBULATORY_CARE_PROVIDER_SITE_OTHER): Payer: Managed Care, Other (non HMO) | Admitting: Obstetrics & Gynecology

## 2020-02-02 ENCOUNTER — Other Ambulatory Visit: Payer: Self-pay

## 2020-02-02 ENCOUNTER — Encounter: Payer: Self-pay | Admitting: Obstetrics & Gynecology

## 2020-02-02 VITALS — BP 120/80 | Ht 63.0 in | Wt 219.0 lb

## 2020-02-02 DIAGNOSIS — Z90710 Acquired absence of both cervix and uterus: Secondary | ICD-10-CM

## 2020-02-02 MED ORDER — FLAVOXATE HCL 100 MG PO TABS
100.0000 mg | ORAL_TABLET | Freq: Three times a day (TID) | ORAL | 1 refills | Status: DC | PRN
Start: 2020-02-02 — End: 2020-11-12

## 2020-02-02 NOTE — Patient Instructions (Signed)
Thank you for choosing [facility]. As part of our ongoing efforts to improve patient experience, we would appreciate your feedback. Please fill out the short survey that you will receive by mail or MyChart. Your opinion is important to us! -Dr Dionicia Cerritos  

## 2020-02-02 NOTE — Progress Notes (Signed)
°  Postoperative Follow-up Patient presents post op from [facility] for abnormal uterine bleeding and fibroids, 2 weeks ago. Path DIAGNOSIS:  A. UTERUS WITH CERVIX AND BILATERAL FALLOPIAN TUBES; TOTAL HYSTERECTOMY  WITH BILATERAL SALPINGECTOMY:  - CERVIX:    - NEGATIVE FOR DYSPLASIA AND MALIGNANCY.  - ENDOMETRIUM:    - INACTIVE. NEGATIVE FOR ATYPIA / EIN AND MALIGNANCY.  - MYOMETRIUM:    - LEIOMYOMATA.  - BILATERAL FALLOPIAN TUBES:    - NO SIGNIFICANT PATHOLOGIC ALTERATION.  Images   Subjective: Patient reports marked improvement in her preop symptoms. Eating a regular diet without difficulty. The patient is not having any pain.  Activity: normal activities of daily living. Patient reports additional symptom's since surgery of No bleeding.  Objective: BP 120/80    Ht 5\' 3"  (1.6 m)    Wt (!) 219 lb (99.3 kg)    BMI 38.79 kg/m  Physical Exam Constitutional:      General: She is not in acute distress.    Appearance: She is well-developed.  Cardiovascular:     Rate and Rhythm: Normal rate.  Pulmonary:     Effort: Pulmonary effort is normal.  Abdominal:     General: There is no distension.     Palpations: Abdomen is soft.     Tenderness: There is no abdominal tenderness.     Comments: Incision Healing Well   Musculoskeletal:        General: Normal range of motion.  Neurological:     Mental Status: She is alert and oriented to person, place, and time.     Cranial Nerves: No cranial nerve deficit.  Skin:    General: Skin is warm and dry.     Assessment: s/p :  total laparoscopic hysterectomy with bilateral salpingectomy progressing well  Plan: Patient has done well after surgery with no apparent complications.  I have discussed the post-operative course to date, and the expected progress moving forward.  The patient understands what complications to be concerned about.  I will see the patient in routine follow up, or sooner if needed.    Activity plan: No  restriction. Pelvic rest.  Hoyt Koch 02/02/2020, 4:55 PM

## 2020-02-10 ENCOUNTER — Ambulatory Visit: Payer: Managed Care, Other (non HMO) | Admitting: Obstetrics & Gynecology

## 2020-03-01 ENCOUNTER — Ambulatory Visit (INDEPENDENT_AMBULATORY_CARE_PROVIDER_SITE_OTHER): Payer: Managed Care, Other (non HMO) | Admitting: Obstetrics & Gynecology

## 2020-03-01 ENCOUNTER — Other Ambulatory Visit: Payer: Self-pay

## 2020-03-01 ENCOUNTER — Encounter: Payer: Self-pay | Admitting: Obstetrics & Gynecology

## 2020-03-01 VITALS — BP 120/80 | Ht 63.0 in | Wt 219.0 lb

## 2020-03-01 DIAGNOSIS — Z90710 Acquired absence of both cervix and uterus: Secondary | ICD-10-CM

## 2020-03-01 NOTE — Progress Notes (Signed)
  Postoperative Follow-up Patient presents post op from [facility] for abnormal uterine bleeding and fibroids, 6 weeks ago.  Subjective: Patient reports marked improvement in her preop symptoms. Eating a regular diet without difficulty. The patient is not having any pain.  Activity: normal activities of daily living. Patient reports additional symptom's since surgery of None.  Objective: BP 120/80   Ht 5\' 3"  (1.6 m)   Wt 219 lb (99.3 kg)   LMP 12/13/2019   BMI 38.79 kg/m  Physical Exam Constitutional:      General: She is not in acute distress.    Appearance: She is well-developed.  Genitourinary:     Pelvic exam was performed with patient supine.     Vagina and rectum normal.     No vaginal erythema or bleeding.     No right or left adnexal mass present.     Right adnexa not tender.     Left adnexa not tender.     Genitourinary Comments: Cervix and uterus absent. Vaginal cuff healing well.  Cardiovascular:     Rate and Rhythm: Normal rate.  Pulmonary:     Effort: Pulmonary effort is normal.  Abdominal:     General: There is no distension.     Palpations: Abdomen is soft.     Tenderness: There is no abdominal tenderness.     Comments: Incision healing well.  Musculoskeletal:        General: Normal range of motion.  Neurological:     Mental Status: She is alert and oriented to person, place, and time.     Cranial Nerves: No cranial nerve deficit.  Skin:    General: Skin is warm and dry.     Assessment: s/p :  total laparoscopic hysterectomy with bilateral salpingectomy progressing well  Plan: Patient has done well after surgery with no apparent complications.  I have discussed the post-operative course to date, and the expected progress moving forward.  The patient understands what complications to be concerned about.  I will see the patient in routine follow up, or sooner if needed.    Activity plan: No restriction.  Hoyt Koch 03/01/2020, 5:01 PM

## 2020-03-01 NOTE — Patient Instructions (Signed)
Thank you for choosing [facility]. As part of our ongoing efforts to improve patient experience, we would appreciate your feedback. Please fill out the short survey that you will receive by mail or MyChart. Your opinion is important to us! -Dr Siria Calandro  

## 2020-04-06 ENCOUNTER — Encounter: Payer: Self-pay | Admitting: Obstetrics and Gynecology

## 2020-04-06 ENCOUNTER — Other Ambulatory Visit: Payer: Self-pay

## 2020-04-06 ENCOUNTER — Ambulatory Visit (INDEPENDENT_AMBULATORY_CARE_PROVIDER_SITE_OTHER): Payer: Managed Care, Other (non HMO) | Admitting: Obstetrics and Gynecology

## 2020-04-06 VITALS — BP 120/80 | Ht 63.0 in | Wt 221.0 lb

## 2020-04-06 DIAGNOSIS — R350 Frequency of micturition: Secondary | ICD-10-CM | POA: Diagnosis not present

## 2020-04-06 DIAGNOSIS — N898 Other specified noninflammatory disorders of vagina: Secondary | ICD-10-CM

## 2020-04-06 LAB — POCT URINALYSIS DIPSTICK
Bilirubin, UA: NEGATIVE
Blood, UA: NEGATIVE
Glucose, UA: NEGATIVE
Ketones, UA: NEGATIVE
Leukocytes, UA: NEGATIVE
Nitrite, UA: NEGATIVE
Protein, UA: NEGATIVE
Spec Grav, UA: 1.015 (ref 1.010–1.025)
pH, UA: 6 (ref 5.0–8.0)

## 2020-04-06 LAB — POCT WET PREP WITH KOH
Clue Cells Wet Prep HPF POC: NEGATIVE
KOH Prep POC: NEGATIVE
Trichomonas, UA: NEGATIVE
Yeast Wet Prep HPF POC: NEGATIVE

## 2020-04-06 MED ORDER — FLUCONAZOLE 150 MG PO TABS: 150.0000 mg | ORAL_TABLET | Freq: Once | ORAL | 0 refills | Status: AC

## 2020-04-06 NOTE — Progress Notes (Signed)
Sharyne Peach, MD   Chief Complaint  Patient presents with  . Vaginal Itching    discharge during intercourse, no odor x 1 week  . Urinary Tract Infection    frequency x 1 week    HPI:      Ms. Kerry [name] is a 34 y.o. G2P0202 whose LMP was Patient's last menstrual period was 12/13/2019., presents today for vaginal itching with slight d/c, no fishy odor for past wk. Also with urinary frequency with good flow and dysuria. No hematuria, LBP, pelvic pain, fevers. No recent abx use, no new soaps/detergents. No meds to treat. She is sex active, no pain/bleeding, no new partners.  S/p TLH/BS/Cysto 7/21 for AUB and leio. Doing well.  Past Medical History:  Diagnosis Date  . Anxiety 04/26/2011  . Post partum depression     Past Surgical History:  Procedure Laterality Date  . CERVICAL CERCLAGE N/A 06/30/2016   Procedure: Exam Under Anesthesia;  Surgeon: Mora Bellman, MD;  Location: [facility];  Service: Gynecology;  Laterality: N/A;  . CESAREAN SECTION  2018  . CYSTOSCOPY N/A 01/20/2020   Procedure: CYSTOSCOPY;  Surgeon: Gae Dry, MD;  Location: [facility];  Service: Gynecology;  Laterality: N/A;  . right foot surgery  2014  . TOTAL LAPAROSCOPIC HYSTERECTOMY WITH SALPINGECTOMY Bilateral 01/20/2020   Procedure: TOTAL LAPAROSCOPIC HYSTERECTOMY WITH BILATERAL SALPINGECTOMY;  Surgeon: Gae Dry, MD;  Location: [facility];  Service: Gynecology;  Laterality: Bilateral;    Family History  Problem Relation Age of Onset  . Hypertension Father   . Gout Father   . Hypertension Mother   . Breast cancer Maternal Aunt 38  . Cancer Maternal Aunt 63       breast  . Hypertension Maternal Grandmother   . Hypertension Maternal Grandfather     Social History   Socioeconomic History  . Marital status: Married    Spouse name: Not on file  . Number of children: Not on file  . Years of education: Not on file  . Highest education level: Not on file  Occupational History  .  Not on file  Tobacco Use  . Smoking status: Never Smoker  . Smokeless tobacco: Never Used  Vaping Use  . Vaping Use: Never used  Substance and Sexual Activity  . Alcohol use: No    Comment: rare  . Drug use: No  . Sexual activity: Yes    Partners: Male  Other Topics Concern  . Not on file  Social History Narrative  . Not on file   Social Determinants of Health   Financial Resource Strain:   . Difficulty of Paying Living Expenses: Not on file  Food Insecurity:   . Worried About Charity fundraiser in the Last Year: Not on file  . Ran Out of Food in the Last Year: Not on file  Transportation Needs:   . Lack of Transportation (Medical): Not on file  . Lack of Transportation (Non-Medical): Not on file  Physical Activity:   . Days of Exercise per Week: Not on file  . Minutes of Exercise per Session: Not on file  Stress:   . Feeling of Stress : Not on file  Social Connections:   . Frequency of Communication with Friends and Family: Not on file  . Frequency of Social Gatherings with Friends and Family: Not on file  . Attends Religious Services: Not on file  . Active Member of Clubs or Organizations: Not on file  .  Attends Archivist Meetings: Not on file  . Marital Status: Not on file  Intimate Partner Violence:   . Fear of Current or Ex-Partner: Not on file  . Emotionally Abused: Not on file  . Physically Abused: Not on file  . Sexually Abused: Not on file    Outpatient Medications Prior to Visit  Medication Sig Dispense Refill  . loratadine (CLARITIN) 10 MG tablet Take 10 mg by mouth daily.    . Multiple Vitamin (MULTIVITAMIN) tablet Take 1 tablet by mouth daily.    Marland Kitchen OVER THE COUNTER MEDICATION Take 2 tablets by mouth daily. [place] Gummies    . ALPRAZolam (XANAX XR) 0.5 MG 24 hr tablet Take 1 tablet (0.5 mg total) by mouth as needed for anxiety. Prior to surgery. (Patient not taking: Reported on 04/06/2020) 4 tablet 0  . flavoxATE (URISPAS) 100 MG tablet Take 1  tablet (100 mg total) by mouth 3 (three) times daily as needed for bladder spasms (bladder spasm). (Patient not taking: Reported on 04/06/2020) 10 tablet 1   No facility-administered medications prior to visit.      ROS:  Review of Systems  Constitutional: Negative for fever.  Gastrointestinal: Negative for blood in stool, constipation, diarrhea, nausea and vomiting.  Genitourinary: Positive for dysuria, frequency and vaginal discharge. Negative for dyspareunia, flank pain, hematuria, urgency, vaginal bleeding and vaginal pain.  Musculoskeletal: Negative for back pain.  Skin: Negative for rash.  BREAST: No symptoms   OBJECTIVE:   Vitals:  BP 120/80   Ht 5\' 3"  (1.6 m)   Wt 221 lb (100.2 kg)   LMP 12/13/2019   Breastfeeding No   BMI 39.15 kg/m   Physical Exam  Results: Results for orders placed or performed in visit on 04/06/20 (from the past 24 hour(s))  POCT Urinalysis Dipstick     Status: Normal   Collection Time: 04/06/20  5:34 PM  Result Value Ref Range   Color, UA yellow    Clarity, UA clear    Glucose, UA Negative Negative   Bilirubin, UA neg    Ketones, UA neg    Spec Grav, UA 1.015 1.010 - 1.025   Blood, UA neg    pH, UA 6.0 5.0 - 8.0   Protein, UA Negative Negative   Urobilinogen, UA     Nitrite, UA neg    Leukocytes, UA Negative Negative   Appearance     Odor    POCT Wet Prep with KOH     Status: Normal   Collection Time: 04/06/20  5:34 PM  Result Value Ref Range   Trichomonas, UA Negative    Clue Cells Wet Prep HPF POC neg    Epithelial Wet Prep HPF POC     Yeast Wet Prep HPF POC neg    Bacteria Wet Prep HPF POC     RBC Wet Prep HPF POC     WBC Wet Prep HPF POC     KOH Prep POC Negative Negative     Assessment/Plan: Vaginal irritation - Plan: POCT Wet Prep with KOH, fluconazole (DIFLUCAN) 150 MG tablet; Neg wet prep, pos sx. Rx diflucan. F/u prn.   Urinary frequency - Plan: POCT Urinalysis Dipstick, Urine Culture; neg UA. Check C&S. Will f/u  if pos.    Meds ordered this encounter  Medications  . fluconazole (DIFLUCAN) 150 MG tablet    Sig: Take 1 tablet (150 mg total) by mouth once for 1 dose.    Dispense:  1 tablet  Refill:  0    Order Specific Question:   Supervising Provider    Answer:   Gae Dry [[id number]]      Return if symptoms worsen or fail to improve.  Mckinzey Entwistle B. Rakisha Pincock, PA-C 04/06/2020 5:35 PM

## 2020-04-06 NOTE — Patient Instructions (Signed)
I value your feedback and entrusting us with your care. If you get a [facility] patient survey, I would appreciate you taking the time to let us know about your experience today. Thank you!  As of June 19, 2019, your lab results will be released to your MyChart immediately, before I even have a chance to see them. Please give me time to review them and contact you if there are any abnormalities. Thank you for your patience.  

## 2020-04-09 LAB — URINE CULTURE

## 2020-06-08 ENCOUNTER — Telehealth: Payer: Self-pay

## 2020-06-08 NOTE — Telephone Encounter (Signed)
Spoke w/patient. Advised to keep apt w/general surgeon for this issue.

## 2020-06-08 NOTE — Telephone Encounter (Signed)
Patient inquiring if she needs to see [facility] or keep apt w/General Surgery regarding the right side of her c-section incision. Patient reports Dr Terri Piedra said it was hernia vs granuloma vs lypoma vs muscle strain. [phone]

## 2020-06-14 ENCOUNTER — Other Ambulatory Visit: Payer: Self-pay | Admitting: Surgery

## 2020-06-14 DIAGNOSIS — R1903 Right lower quadrant abdominal swelling, mass and lump: Secondary | ICD-10-CM

## 2020-06-21 ENCOUNTER — Other Ambulatory Visit: Payer: Self-pay

## 2020-06-21 ENCOUNTER — Ambulatory Visit
Admission: RE | Admit: 2020-06-21 | Discharge: 2020-06-21 | Disposition: A | Discharge status: Home / Self Care | Payer: Managed Care, Other (non HMO) | Source: Ambulatory Visit | Attending: Surgery | Admitting: Surgery

## 2020-06-21 DIAGNOSIS — R1903 Right lower quadrant abdominal swelling, mass and lump: Secondary | ICD-10-CM | POA: Diagnosis not present

## 2020-10-27 ENCOUNTER — Telehealth: Payer: Managed Care, Other (non HMO) | Admitting: Nurse Practitioner

## 2020-10-27 ENCOUNTER — Encounter: Payer: Self-pay | Admitting: Nurse Practitioner

## 2020-10-27 DIAGNOSIS — H9202 Otalgia, left ear: Secondary | ICD-10-CM | POA: Diagnosis not present

## 2020-10-27 DIAGNOSIS — J018 Other acute sinusitis: Secondary | ICD-10-CM | POA: Diagnosis not present

## 2020-10-27 MED ORDER — FLUTICASONE PROPIONATE 50 MCG/ACT NA SUSP: 2.0000 | Freq: Every day | NASAL | 6 refills | Status: DC

## 2020-10-27 MED ORDER — FLUCONAZOLE 150 MG PO TABS
150.0000 mg | ORAL_TABLET | ORAL | 0 refills | Status: DC | PRN
Start: 2020-10-27 — End: 2020-11-12

## 2020-10-27 MED ORDER — AMOXICILLIN 875 MG PO TABS: 875.0000 mg | ORAL_TABLET | Freq: Two times a day (BID) | ORAL | 0 refills | Status: DC

## 2020-10-27 NOTE — Patient Instructions (Signed)
Earache, Adult An earache, or ear pain, can be caused by many things, including:  An infection.  Ear wax buildup.  Ear pressure.  Something in the ear that should not be there (foreign body).  A sore throat.  Tooth problems.  Jaw problems. Treatment of the earache will depend on the cause. If the cause is not clear or cannot be determined, you may need to watch your symptoms until your earache goes away or until a cause is found. Follow these instructions at home: Medicines  Take or apply over-the-counter and prescription medicines only as told by your health care provider.  If you were prescribed an antibiotic medicine, use it as told by your health care provider. Do not stop using the antibiotic even if you start to feel better.  Do not put anything in your ear other than medicine that is prescribed by your health care provider. Managing pain If directed, apply heat to the affected area as often as told by your health care provider. Use the heat source that your health care provider recommends, such as a moist heat pack or a heating pad.  Place a towel between your skin and the heat source.  Leave the heat on for 20-30 minutes.  Remove the heat if your skin turns bright red. This is especially important if you are unable to feel pain, heat, or cold. You may have a greater risk of getting burned. If directed, put ice on the affected area as often as told by your health care provider. To do this:  Put ice in a plastic bag.  Place a towel between your skin and the bag.  Leave the ice on for 20 minutes, 2-3 times a day.      General instructions  Pay attention to any changes in your symptoms.  Try resting in an upright position instead of lying down. This may help to reduce pressure in your ear and relieve pain.  Chew gum if it helps to relieve your ear pain.  Treat any allergies as told by your health care provider.  Drink enough fluid to keep your urine pale  yellow.  It is up to you to get the results of any tests that were done. Ask your health care provider, or the department that is doing the tests, when your results will be ready.  Keep all follow-up visits as told by your health care provider. This is important. Contact a health care provider if:  Your pain does not improve within 2 days.  Your earache gets worse.  You have new symptoms.  You have a fever. Get help right away if you:  Have a severe headache.  Have a stiff neck.  Have trouble swallowing.  Have redness or swelling behind your ear.  Have fluid or blood coming from your ear.  Have hearing loss.  Feel dizzy. Summary  An earache, or ear pain, can be caused by many things.  Treatment of the earache will depend on the cause. Follow recommendations from your health care provider to treat your ear pain.  If the cause is not clear or cannot be determined, you may need to watch your symptoms until your earache goes away or until a cause is found.  Keep all follow-up visits as told by your health care provider. This is important. This information is not intended to replace advice given to you by your health care provider. Make sure you discuss any questions you have with your health care provider. Document Revised:  02/01/2019 Document Reviewed: 02/01/2019 Elsevier Patient Education  2021 [employer].  Sinusitis, Adult Sinusitis is soreness and swelling (inflammation) of your sinuses. Sinuses are hollow spaces in the bones around your face. They are located:  Around your eyes.  In the middle of your forehead.  Behind your nose.  In your cheekbones. Your sinuses and nasal passages are lined with a fluid called mucus. Mucus drains out of your sinuses. Swelling can trap mucus in your sinuses. This lets germs (bacteria, virus, or fungus) grow, which leads to infection. Most of the time, this condition is caused by a virus. What are the causes? This condition is  caused by:  Allergies.  Asthma.  Germs.  Things that block your nose or sinuses.  Growths in the nose (nasal polyps).  Chemicals or irritants in the air.  Fungus (rare). What increases the risk? You are more likely to develop this condition if:  You have a weak body defense system (immune system).  You do a lot of swimming or diving.  You use nasal sprays too much.  You smoke. What are the signs or symptoms? The main symptoms of this condition are pain and a feeling of pressure around the sinuses. Other symptoms include:  Stuffy nose (congestion).  Runny nose (drainage).  Swelling and warmth in the sinuses.  Headache.  Toothache.  A cough that may get worse at night.  Mucus that collects in the throat or the back of the nose (postnasal drip).  Being unable to smell and taste.  Being very tired (fatigue).  A fever.  Sore throat.  Bad breath. How is this diagnosed? This condition is diagnosed based on:  Your symptoms.  Your medical history.  A physical exam.  Tests to find out if your condition is short-term (acute) or long-term (chronic). Your doctor may: ? Check your nose for growths (polyps). ? Check your sinuses using a tool that has a light (endoscope). ? Check for allergies or germs. ? Do imaging tests, such as an MRI or CT scan. How is this treated? Treatment for this condition depends on the cause and whether it is short-term or long-term.  If caused by a virus, your symptoms should go away on their own within 10 days. You may be given medicines to relieve symptoms. They include: ? Medicines that shrink swollen tissue in the nose. ? Medicines that treat allergies (antihistamines). ? A spray that treats swelling of the nostrils. ? Rinses that help get rid of thick mucus in your nose (nasal saline washes).  If caused by bacteria, your doctor may wait to see if you will get better without treatment. You may be given antibiotic medicine if  you have: ? A very bad infection. ? A weak body defense system.  If caused by growths in the nose, you may need to have surgery. Follow these instructions at home: Medicines  Take, use, or apply over-the-counter and prescription medicines only as told by your doctor. These may include nasal sprays.  If you were prescribed an antibiotic medicine, take it as told by your doctor. Do not stop taking the antibiotic even if you start to feel better. Hydrate and humidify  Drink enough water to keep your pee (urine) pale yellow.  Use a cool mist humidifier to keep the humidity level in your home above 50%.  Breathe in steam for 10-15 minutes, 3-4 times a day, or as told by your doctor. You can do this in the bathroom while a hot shower is  running.  Try not to spend time in cool or dry air.   Rest  Rest as much as you can.  Sleep with your head raised (elevated).  Make sure you get enough sleep each night. General instructions  Put a warm, moist washcloth on your face 3-4 times a day, or as often as told by your doctor. This will help with discomfort.  Wash your hands often with soap and water. If there is no soap and water, use hand sanitizer.  Do not smoke. Avoid being around people who are smoking (secondhand smoke).  Keep all follow-up visits as told by your doctor. This is important.   Contact a doctor if:  You have a fever.  Your symptoms get worse.  Your symptoms do not get better within 10 days. Get help right away if:  You have a very bad headache.  You cannot stop throwing up (vomiting).  You have very bad pain or swelling around your face or eyes.  You have trouble seeing.  You feel confused.  Your neck is stiff.  You have trouble breathing. Summary  Sinusitis is swelling of your sinuses. Sinuses are hollow spaces in the bones around your face.  This condition is caused by tissues in your nose that become inflamed or swollen. This traps germs. These can  lead to infection.  If you were prescribed an antibiotic medicine, take it as told by your doctor. Do not stop taking it even if you start to feel better.  Keep all follow-up visits as told by your doctor. This is important. This information is not intended to replace advice given to you by your health care provider. Make sure you discuss any questions you have with your health care provider. Document Revised: 11/26/2017 Document Reviewed: 11/26/2017 Elsevier Patient Education  2021 [employer].  Probiotics Probiotics are the good bacteria and yeasts that live in your body and keep your digestive system healthy. Probiotics also help your body's defense system (immune system) and protect your body against the growth of harmful bacteria. Your health care provider may recommend taking a probiotic if you are taking antibiotics or have certain medical conditions, such as:  Diarrhea.  Constipation.  Irritable bowel syndrome.  Lung infections.  Yeast infections.  Acne, eczema, and other skin conditions.  Frequent urinary tract infections. What affects the balance of bacteria in my body? The balance of good bacteria in your body can be affected by:  Antibiotic medicines. These medicines treat infections caused by bacteria. Unfortunately, they may kill the good bacteria in your body as well as the bad bacteria.  Certain medical conditions. Conditions related to an imbalance of bacteria include: ? Stomach and intestine (gastrointestinal) infections. ? Lung infections. ? Skin infections. ? Vaginal infections. ? Inflammatory bowel diseases. ? Stomach ulcers (gastric ulcers). ? Tooth decay and gum disease (periodontal disease).  Stress.  Poor diet. What type of probiotic is right for me? Probiotics contain different types of bacteria (strains). Strains commonly found in probiotics include:  Lactobacillus.  Saccharomyces.  Bifidobacterium. Specific strains have been shown to be  more effective for certain health conditions. Ask your health care provider which strain or strains you should use and how often. Probiotics come in many different forms, strain combinations, and strengths. Some may need to be refrigerated. Always read the label for storage and usage instructions. Certain foods, such as yogurt, contain probiotics. Probiotics can also be bought as a supplement at a pharmacy, health food store, or grocery store. Talk  to your health care provider before starting any supplement.   What are the side effects of probiotics? Some people have side effects when taking probiotics. Side effects are usually temporary and may include:  Gas.  Bloating.  Cramping. Serious side effects are rare. Follow these instructions at home:  If you are taking probiotics with antibiotics: ? Wait at least 2 hours between taking your medicine and the probiotic. ? Eat foods high in fiber, such as whole grains, beans, and vegetables. These foods can help good bacteria grow. ? Avoid certain foods as told by your health care provider.   Summary  Probiotics are the good bacteria and yeasts that live in your body and keep you and your digestive system healthy.  Certain foods, such as yogurt, contain probiotics.  Probiotics can be taken as supplements. They can be bought at a pharmacy, health food store, or grocery store. They come in many different forms, strain combinations, and strengths.  Be sure to talk with your health care provider before taking a probiotic supplement. This information is not intended to replace advice given to you by your health care provider. Make sure you discuss any questions you have with your health care provider. Document Revised: 03/15/2018 Document Reviewed: 07/11/2017 Elsevier Patient Education  2021 [employer].

## 2020-10-27 NOTE — Progress Notes (Signed)
Virtual Visit via Video Note  I connected with Kerry [name] on 10/27/20 at 12:30 PM EDT by a video enabled telemedicine application and verified that I am speaking with the correct person using two identifiers.  Location: Patient: Home Provider: [facility]   I discussed the limitations of evaluation and management by telemedicine and the availability of in person appointments. The patient expressed understanding and agreed to proceed.  Past Medical History:  Diagnosis Date  . Anxiety 04/26/2011  . Post partum depression    Past Surgical History:  Procedure Laterality Date  . CERVICAL CERCLAGE N/A 06/30/2016   Procedure: Exam Under Anesthesia;  Surgeon: Mora Bellman, MD;  Location: [facility];  Service: Gynecology;  Laterality: N/A;  . CESAREAN SECTION  2018  . CYSTOSCOPY N/A 01/20/2020   Procedure: CYSTOSCOPY;  Surgeon: Gae Dry, MD;  Location: [facility];  Service: Gynecology;  Laterality: N/A;  . right foot surgery  2014  . TOTAL LAPAROSCOPIC HYSTERECTOMY WITH SALPINGECTOMY Bilateral 01/20/2020   Procedure: TOTAL LAPAROSCOPIC HYSTERECTOMY WITH BILATERAL SALPINGECTOMY;  Surgeon: Gae Dry, MD;  Location: [facility];  Service: Gynecology;  Laterality: Bilateral;   Current Outpatient Medications on File Prior to Visit  Medication Sig Dispense Refill  . ALPRAZolam (XANAX XR) 0.5 MG 24 hr tablet Take 1 tablet (0.5 mg total) by mouth as needed for anxiety. Prior to surgery. (Patient not taking: Reported on 04/06/2020) 4 tablet 0  . flavoxATE (URISPAS) 100 MG tablet Take 1 tablet (100 mg total) by mouth 3 (three) times daily as needed for bladder spasms (bladder spasm). (Patient not taking: Reported on 04/06/2020) 10 tablet 1  . loratadine (CLARITIN) 10 MG tablet Take 10 mg by mouth daily.    . Multiple Vitamin (MULTIVITAMIN) tablet Take 1 tablet by mouth daily.    Marland Kitchen OVER THE COUNTER MEDICATION Take 2 tablets by mouth daily. [place] Gummies     No current facility-administered  medications on file prior to visit.   No Known Allergies  History of Present Illness: Kerry [name] is a 35 y.o. female with c/o left ear pain and sinus headache. Onset of symptoms started 10/24/20. Patient also c/o itchy, watery eyes, seasonal allergy flare up and post nasal drainage. She denies fever, chills, sore throat, cough, n/v/d, abdominal pain, UTI sx or any other problems. She is treating her symptoms with Claritin and has eye drops for allergies.    Observations/Objective: Patient is alert and oriented in NAD. She is speaking in full sentences without difficulty. There of course is no physical exam done due to this being a virtual visit.   Assessment and Plan: 1. Left ear pain - amoxicillin (AMOXIL) 875 MG tablet; Take 1 tablet (875 mg total) by mouth 2 (two) times daily.  Dispense: 14 tablet; Refill: 0  2. Other acute sinusitis, recurrence not specified - fluticasone (FLONASE) 50 MCG/ACT nasal spray; Place 2 sprays into both nostrils daily.  Dispense: 16 g; Refill: 6  Diflucan 150 mg. With start of antibiotic and repeat in 3 days if needed.   Follow Up Instructions: In "My Chart".  I discussed the assessment and treatment plan with the patient. The patient was provided an opportunity to ask questions and all were answered. The patient agreed with the plan and demonstrated an understanding of the instructions.   The patient was advised to call back or seek an in-person evaluation if the symptoms worsen or if the condition fails to improve as anticipated.  I provided 12 minutes of non-face-to-face  time during this encounter.   Debroah Baller, NP

## 2020-11-09 ENCOUNTER — Ambulatory Visit: Payer: Self-pay | Admitting: Surgery

## 2020-11-09 NOTE — H&P (Addendum)
Subjective:   CC: Right lower quadrant abdominal mass [R19.03]  HPI:  Kerry [name] is a 35 y.o. female who returns for evaluation of above. First noted couple years. Symptoms include: Pain is sharp, intermittent, lcoated at edge of c-section scar on right.  Sudden onset, noticed it after a "light" workout.  Exacerbated by palpation.  Alleviated by heat and muscle relaxer.  Associated with palpable lump in the area of concern.  Lump supposedly was not present before.  Since last visit, pain continues to be episodic, increasing in size.  Still No change in BM or urinary habits reported     Past Medical History:  has a past medical history of Acne.  Past Surgical History:  has a past surgical history that includes bunionectomy (Right).  Family History: family history includes Anxiety in her mother; Breast cancer in her mother; High blood pressure (Hypertension) in her father and mother.  Social History:  reports that she has never smoked. She has never used smokeless tobacco. She reports that she does not drink alcohol and does not use drugs.  Current Medications: has a current medication list which includes the following prescription(s): loratadine, multivitamin, and gabapentin.  Allergies:  No Known Allergies  ROS:  A 15 point review of systems was performed and pertinent positives and negatives noted in HPI   Objective:   BP 103/71   Pulse 81   Ht 160 cm (5\' 3" )   Wt 98.4 kg (217 lb)   LMP 08/29/2019 (Exact Date)   BMI 38.44 kg/m   Constitutional :  alert, appears stated age, cooperative and no distress  Lymphatics/Throat:  no asymmetry, masses, or scars  Respiratory:  clear to auscultation bilaterally  Cardiovascular:  regular rate and rhythm  Gastrointestinal: soft, non-tender; bowel sounds normal; no masses,  no organomegaly.    Musculoskeletal: Steady gait and movement  Skin: Cool and moist, c-section surgical scar. At far right border of  incision,palpable lump noted in subcutaneous layer, slightly tender to touch. Lump is still smooth, non-mobile. Size has increased again to apporximately 4cm x 3cm/  No overlying skin changes, induration.  Psychiatric: Normal affect, non-agitated, not confused       LABS:  n/a   RADS: CLINICAL DATA: RIGHT lower quadrant mass at Caesarean scar since  August 2020, increasing in size and sometimes tender   EXAM:  ULTRASOUND ABDOMEN LIMITED   COMPARISON: 11/24/2019 pelvic ultrasound   FINDINGS:  At the site of clinical concern at theRIGHT lateral aspect of the  Caesarean section scar, a complex hypoechoic/cystic lesion is  identified measuring 2.7 x 1.9 x 1.8 cm. This is macrolobulated and  multiloculated in character and contains anechoic cystic regions,  complex hypoechoic internal echogenicity within some locules, and  additional areas which contain heterogeneous echogenicity. No  accompanying hypervascularity on color Doppler imaging. No shadowing  calcifications. Lesion is immediately subcutaneous in position and  nonspecificin appearance. This could represent a complex seroma,  sequela of prior hemorrhage, complicated suture granuloma with  degeneration/necrosis, or other nonspecific cystic mass.   IMPRESSION:  Nonspecific macrolobulated multiloculated complex cystic lesion at  the site of clinical concern in the RIGHT lower quadrant, 2.7 cm,  question enlarging complex seroma, sequela of hemorrhage,  complicated suture granuloma with degeneration/necrosis, or other  nonspecific cystic mass; since this is nonspecific, consider  excision.    Electronically Signed  By: Lavonia Dana M.D.  On: 06/21/2020 13:02  Assessment:      Right lower quadrant abdominal mass [R19.03]  Plan:  1. Right lower quadrant abdominal mass [R19.03]  Pt now ready for excision. Discussed surgical excision.  Alternatives include continued observation.  Benefits include possible  symptom relief, pathologic evaluation, improved cosmesis. Discussed the risk of surgery including recurrence, chronic pain, post-op infxn, poor cosmesis, poor/delayed wound healing, and possible re-operation to address said risks. The risks of general anesthetic, if used, includes MI, CVA, sudden death or even reaction to anesthetic medications also discussed.  Typical post-op recovery time of 3-5 days with possible activity restrictions were also discussed.  The patient verbalized understanding and all questions were answered to the patient's satisfaction.  Will schedule.  Abdominal wall mass/cyst excision

## 2020-11-09 NOTE — H&P (View-Only) (Signed)
Subjective:   CC: Right lower quadrant abdominal mass [R19.03]  HPI:  Kerry [name] is a 35 y.o. female who returns for evaluation of above. First noted couple years. Symptoms include: Pain is sharp, intermittent, lcoated at edge of c-section scar on right.  Sudden onset, noticed it after a "light" workout.  Exacerbated by palpation.  Alleviated by heat and muscle relaxer.  Associated with palpable lump in the area of concern.  Lump supposedly was not present before.  Since last visit, pain continues to be episodic, increasing in size.  Still No change in BM or urinary habits reported     Past Medical History:  has a past medical history of Acne.  Past Surgical History:  has a past surgical history that includes bunionectomy (Right).  Family History: family history includes Anxiety in her mother; Breast cancer in her mother; High blood pressure (Hypertension) in her father and mother.  Social History:  reports that she has never smoked. She has never used smokeless tobacco. She reports that she does not drink alcohol and does not use drugs.  Current Medications: has a current medication list which includes the following prescription(s): loratadine, multivitamin, and gabapentin.  Allergies:  No Known Allergies  ROS:  A 15 point review of systems was performed and pertinent positives and negatives noted in HPI   Objective:   BP 103/71   Pulse 81   Ht 160 cm (5\' 3" )   Wt 98.4 kg (217 lb)   LMP 08/29/2019 (Exact Date)   BMI 38.44 kg/m   Constitutional :  alert, appears stated age, cooperative and no distress  Lymphatics/Throat:  no asymmetry, masses, or scars  Respiratory:  clear to auscultation bilaterally  Cardiovascular:  regular rate and rhythm  Gastrointestinal: soft, non-tender; bowel sounds normal; no masses,  no organomegaly.    Musculoskeletal: Steady gait and movement  Skin: Cool and moist, c-section surgical scar. At far right border of  incision,palpable lump noted in subcutaneous layer, slightly tender to touch. Lump is still smooth, non-mobile. Size has increased again to apporximately 4cm x 3cm/  No overlying skin changes, induration.  Psychiatric: Normal affect, non-agitated, not confused       LABS:  n/a   RADS: CLINICAL DATA: RIGHT lower quadrant mass at Caesarean scar since  August 2020, increasing in size and sometimes tender   EXAM:  ULTRASOUND ABDOMEN LIMITED   COMPARISON: 11/24/2019 pelvic ultrasound   FINDINGS:  At the site of clinical concern at theRIGHT lateral aspect of the  Caesarean section scar, a complex hypoechoic/cystic lesion is  identified measuring 2.7 x 1.9 x 1.8 cm. This is macrolobulated and  multiloculated in character and contains anechoic cystic regions,  complex hypoechoic internal echogenicity within some locules, and  additional areas which contain heterogeneous echogenicity. No  accompanying hypervascularity on color Doppler imaging. No shadowing  calcifications. Lesion is immediately subcutaneous in position and  nonspecificin appearance. This could represent a complex seroma,  sequela of prior hemorrhage, complicated suture granuloma with  degeneration/necrosis, or other nonspecific cystic mass.   IMPRESSION:  Nonspecific macrolobulated multiloculated complex cystic lesion at  the site of clinical concern in the RIGHT lower quadrant, 2.7 cm,  question enlarging complex seroma, sequela of hemorrhage,  complicated suture granuloma with degeneration/necrosis, or other  nonspecific cystic mass; since this is nonspecific, consider  excision.    Electronically Signed  By: Lavonia Dana M.D.  On: 06/21/2020 13:02  Assessment:      Right lower quadrant abdominal mass [R19.03]  Plan:  1. Right lower quadrant abdominal mass [R19.03]  Pt now ready for excision. Discussed surgical excision.  Alternatives include continued observation.  Benefits include possible  symptom relief, pathologic evaluation, improved cosmesis. Discussed the risk of surgery including recurrence, chronic pain, post-op infxn, poor cosmesis, poor/delayed wound healing, and possible re-operation to address said risks. The risks of general anesthetic, if used, includes MI, CVA, sudden death or even reaction to anesthetic medications also discussed.  Typical post-op recovery time of 3-5 days with possible activity restrictions were also discussed.  The patient verbalized understanding and all questions were answered to the patient's satisfaction.  Will schedule.  Abdominal wall mass/cyst excision

## 2020-11-12 ENCOUNTER — Encounter
Admission: RE | Admit: 2020-11-12 | Discharge: 2020-11-12 | Disposition: A | Discharge status: Home / Self Care | Payer: Managed Care, Other (non HMO) | Source: Ambulatory Visit | Attending: Surgery | Admitting: Surgery

## 2020-11-12 ENCOUNTER — Other Ambulatory Visit: Payer: Self-pay

## 2020-11-12 NOTE — Patient Instructions (Signed)
Your procedure is scheduled on: 11/19/20 Report to [facility]. To find out your arrival time please call [phone] between 1PM - 3PM on 11/18/20.  Remember: Instructions that are not followed completely may result in serious medical risk, up to and including death, or upon the discretion of your surgeon and anesthesiologist your surgery may need to be rescheduled.     _X__ 1. Do not eat food after midnight the night before your procedure.                 No gum chewing or hard candies. You may drink clear liquids up to 2 hours                 before you are scheduled to arrive for your surgery- DO not drink clear                 liquids within 2 hours of the start of your surgery.                 Clear Liquids include:  water, apple juice without pulp, clear carbohydrate                 drink such as Clearfast or Gatorade, Black Coffee or Tea (Do not add                 anything to coffee or tea). Diabetics water only  __X__2.  On the morning of surgery brush your teeth with toothpaste and water, you                 may rinse your mouth with mouthwash if you wish.  Do not swallow any              toothpaste of mouthwash.     _X__ 3.  No Alcohol for 24 hours before or after surgery.   _X__ 4.  Do Not Smoke or use e-cigarettes For 24 Hours Prior to Your Surgery.                 Do not use any chewable tobacco products for at least 6 hours prior to                 surgery.  ____  5.  Bring all medications with you on the day of surgery if instructed.   __X__  6.  Notify your doctor if there is any change in your medical condition      (cold, fever, infections).     Do not wear jewelry, make-up, hairpins, clips or nail polish. Do not wear lotions, powders, or perfumes.  Do not shave 48 hours prior to surgery. Men may shave face and neck. Do not bring valuables to the hospital.    [facility] is not responsible for any belongings or  valuables.  Contacts, dentures/partials or body piercings may not be worn into surgery. Bring a case for your contacts, glasses or hearing aids, a denture cup will be supplied. Leave your suitcase in the car. After surgery it may be brought to your room. For patients admitted to the hospital, discharge time is determined by your treatment team.   Patients discharged the day of surgery will not be allowed to drive home.   Please read over the following fact sheets that you were given:     __X__ Take these medicines the morning of surgery with A SIP OF WATER:  1. loratadine (CLARITIN) 10 MG tablet if needed  2.   3.   4.  5.  6.  ____ Fleet Enema (as directed)   ____ Use CHG Soap/SAGE wipes as directed  ____ Use inhalers on the day of surgery  ____ Stop metformin/Janumet/Farxiga 2 days prior to surgery    ____ Take 1/2 of usual insulin dose the night before surgery. No insulin the morning          of surgery.   ____ Stop Blood Thinners Coumadin/Plavix/Xarelto/Pleta/Pradaxa/Eliquis/Effient/Aspirin  on   Or contact your Surgeon, Cardiologist or Medical Doctor regarding  ability to stop your blood thinners  __X__ Stop Anti-inflammatories 7 days before surgery such as Advil, Ibuprofen, Motrin,  BC or Goodies Powder, Naprosyn, Naproxen, Aleve, Aspirin    __X__ Stop all herbal supplements, fish oil or vitamin E until after surgery.    ____ Bring C-Pap to the hospital.

## 2020-11-17 ENCOUNTER — Other Ambulatory Visit: Payer: Managed Care, Other (non HMO)

## 2020-11-19 ENCOUNTER — Other Ambulatory Visit: Payer: Self-pay

## 2020-11-19 ENCOUNTER — Ambulatory Visit: Payer: Managed Care, Other (non HMO) | Admitting: Anesthesiology

## 2020-11-19 ENCOUNTER — Encounter: Payer: Self-pay | Admitting: Surgery

## 2020-11-19 ENCOUNTER — Encounter
Admission: RE | Disposition: A | Discharge status: Home / Self Care | Payer: Self-pay | Source: Home / Self Care | Attending: Surgery

## 2020-11-19 ENCOUNTER — Ambulatory Visit
Admission: RE | Admit: 2020-11-19 | Discharge: 2020-11-19 | Disposition: A | Discharge status: Home / Self Care | Payer: Managed Care, Other (non HMO) | Attending: Surgery | Admitting: Surgery

## 2020-11-19 DIAGNOSIS — R1903 Right lower quadrant abdominal swelling, mass and lump: Secondary | ICD-10-CM | POA: Diagnosis present

## 2020-11-19 DIAGNOSIS — Z79899 Other long term (current) drug therapy: Secondary | ICD-10-CM | POA: Diagnosis not present

## 2020-11-19 DIAGNOSIS — N806 Endometriosis in cutaneous scar: Secondary | ICD-10-CM | POA: Diagnosis not present

## 2020-11-19 DIAGNOSIS — R222 Localized swelling, mass and lump, trunk: Secondary | ICD-10-CM

## 2020-11-19 HISTORY — PX: LIPOMA EXCISION: SHX5283

## 2020-11-19 SURGERY — EXCISION LIPOMA
Anesthesia: General | Site: Abdomen | Laterality: Right

## 2020-11-19 MED ORDER — ONDANSETRON HCL 4 MG/2ML IJ SOLN
INTRAMUSCULAR | Status: AC
  Administered 2020-11-19: 4 mg via INTRAVENOUS
  Filled 2020-11-19: qty 2

## 2020-11-19 MED ORDER — CHLORHEXIDINE GLUCONATE 0.12 % MT SOLN
OROMUCOSAL | Status: AC
  Filled 2020-11-19: qty 15

## 2020-11-19 MED ORDER — LIDOCAINE HCL (CARDIAC) PF 100 MG/5ML IV SOSY
PREFILLED_SYRINGE | INTRAVENOUS | Status: DC | PRN
  Administered 2020-11-19: 100 mg via INTRAVENOUS

## 2020-11-19 MED ORDER — CHLORHEXIDINE GLUCONATE 0.12 % MT SOLN
15.0000 mL | Freq: Once | OROMUCOSAL | Status: AC
  Administered 2020-11-19: 15 mL via OROMUCOSAL

## 2020-11-19 MED ORDER — PROPOFOL 10 MG/ML IV BOLUS
INTRAVENOUS | Status: DC | PRN
  Administered 2020-11-19: 200 mg via INTRAVENOUS

## 2020-11-19 MED ORDER — IBUPROFEN 800 MG PO TABS: 800.0000 mg | ORAL_TABLET | Freq: Three times a day (TID) | ORAL | 0 refills | Status: DC | PRN

## 2020-11-19 MED ORDER — FENTANYL CITRATE (PF) 100 MCG/2ML IJ SOLN
INTRAMUSCULAR | Status: AC
  Filled 2020-11-19: qty 2

## 2020-11-19 MED ORDER — ORAL CARE MOUTH RINSE: 15.0000 mL | Freq: Once | OROMUCOSAL | Status: AC

## 2020-11-19 MED ORDER — ONDANSETRON HCL 4 MG/2ML IJ SOLN
INTRAMUSCULAR | Status: AC
  Filled 2020-11-19: qty 2

## 2020-11-19 MED ORDER — LIDOCAINE HCL 1 % IJ SOLN
INTRAMUSCULAR | Status: DC | PRN
  Administered 2020-11-19: 10 mL

## 2020-11-19 MED ORDER — FENTANYL CITRATE (PF) 100 MCG/2ML IJ SOLN
25.0000 ug | INTRAMUSCULAR | Status: DC | PRN
  Administered 2020-11-19 (×4): 25 ug via INTRAVENOUS

## 2020-11-19 MED ORDER — KETOROLAC TROMETHAMINE 30 MG/ML IJ SOLN
INTRAMUSCULAR | Status: AC
  Filled 2020-11-19: qty 1

## 2020-11-19 MED ORDER — KETOROLAC TROMETHAMINE 30 MG/ML IJ SOLN
INTRAMUSCULAR | Status: DC | PRN
  Administered 2020-11-19: 30 mg via INTRAVENOUS

## 2020-11-19 MED ORDER — MIDAZOLAM HCL 2 MG/2ML IJ SOLN
INTRAMUSCULAR | Status: AC
  Filled 2020-11-19: qty 2

## 2020-11-19 MED ORDER — CEFAZOLIN SODIUM-DEXTROSE 2-4 GM/100ML-% IV SOLN
INTRAVENOUS | Status: AC
  Filled 2020-11-19: qty 100

## 2020-11-19 MED ORDER — ACETAMINOPHEN 325 MG PO TABS: 650.0000 mg | ORAL_TABLET | Freq: Three times a day (TID) | ORAL | 0 refills | Status: AC | PRN

## 2020-11-19 MED ORDER — FENTANYL CITRATE (PF) 100 MCG/2ML IJ SOLN
INTRAMUSCULAR | Status: AC
  Administered 2020-11-19: 25 ug via INTRAVENOUS
  Filled 2020-11-19: qty 2

## 2020-11-19 MED ORDER — BUPIVACAINE-EPINEPHRINE (PF) 0.5% -1:200000 IJ SOLN
INTRAMUSCULAR | Status: AC
  Filled 2020-11-19: qty 30

## 2020-11-19 MED ORDER — ONDANSETRON HCL 4 MG/2ML IJ SOLN: 4.0000 mg | Freq: Once | INTRAMUSCULAR | Status: AC | PRN

## 2020-11-19 MED ORDER — BUPIVACAINE-EPINEPHRINE 0.5% -1:200000 IJ SOLN
INTRAMUSCULAR | Status: DC | PRN
  Administered 2020-11-19: 10 mL

## 2020-11-19 MED ORDER — DEXAMETHASONE SODIUM PHOSPHATE 10 MG/ML IJ SOLN
INTRAMUSCULAR | Status: AC
  Filled 2020-11-19: qty 1

## 2020-11-19 MED ORDER — FAMOTIDINE 20 MG PO TABS
ORAL_TABLET | ORAL | Status: AC
  Filled 2020-11-19: qty 1

## 2020-11-19 MED ORDER — DEXAMETHASONE SODIUM PHOSPHATE 10 MG/ML IJ SOLN
INTRAMUSCULAR | Status: DC | PRN
  Administered 2020-11-19: 5 mg via INTRAVENOUS

## 2020-11-19 MED ORDER — LACTATED RINGERS IV SOLN: INTRAVENOUS | Status: DC

## 2020-11-19 MED ORDER — CHLORHEXIDINE GLUCONATE CLOTH 2 % EX PADS: 6.0000 | MEDICATED_PAD | Freq: Once | CUTANEOUS | Status: DC

## 2020-11-19 MED ORDER — HYDROCODONE-ACETAMINOPHEN 5-325 MG PO TABS: 1.0000 | ORAL_TABLET | Freq: Four times a day (QID) | ORAL | 0 refills | Status: DC | PRN

## 2020-11-19 MED ORDER — ROCURONIUM BROMIDE 100 MG/10ML IV SOLN
INTRAVENOUS | Status: DC | PRN
  Administered 2020-11-19: 50 mg via INTRAVENOUS

## 2020-11-19 MED ORDER — ONDANSETRON HCL 4 MG/2ML IJ SOLN
INTRAMUSCULAR | Status: DC | PRN
  Administered 2020-11-19: 4 mg via INTRAVENOUS

## 2020-11-19 MED ORDER — SUGAMMADEX SODIUM 200 MG/2ML IV SOLN
INTRAVENOUS | Status: DC | PRN
  Administered 2020-11-19: 200 mg via INTRAVENOUS

## 2020-11-19 MED ORDER — PROPOFOL 10 MG/ML IV BOLUS
INTRAVENOUS | Status: AC
  Filled 2020-11-19: qty 20

## 2020-11-19 MED ORDER — FENTANYL CITRATE (PF) 100 MCG/2ML IJ SOLN
INTRAMUSCULAR | Status: DC | PRN
  Administered 2020-11-19: 50 ug via INTRAVENOUS

## 2020-11-19 MED ORDER — FAMOTIDINE 20 MG PO TABS
20.0000 mg | ORAL_TABLET | Freq: Once | ORAL | Status: AC
  Administered 2020-11-19: 20 mg via ORAL

## 2020-11-19 MED ORDER — HYDROCODONE-ACETAMINOPHEN 5-325 MG PO TABS
ORAL_TABLET | ORAL | Status: AC
  Administered 2020-11-19: 1 via ORAL
  Filled 2020-11-19: qty 1

## 2020-11-19 MED ORDER — CEFAZOLIN SODIUM-DEXTROSE 2-4 GM/100ML-% IV SOLN
2.0000 g | INTRAVENOUS | Status: AC
  Administered 2020-11-19: 2 g via INTRAVENOUS

## 2020-11-19 MED ORDER — LIDOCAINE HCL (PF) 1 % IJ SOLN
INTRAMUSCULAR | Status: AC
  Filled 2020-11-19: qty 30

## 2020-11-19 MED ORDER — DEXMEDETOMIDINE (PRECEDEX) IN NS 20 MCG/5ML (4 MCG/ML) IV SYRINGE
PREFILLED_SYRINGE | INTRAVENOUS | Status: DC | PRN
  Administered 2020-11-19 (×2): 4 ug via INTRAVENOUS

## 2020-11-19 MED ORDER — ROCURONIUM BROMIDE 10 MG/ML (PF) SYRINGE
PREFILLED_SYRINGE | INTRAVENOUS | Status: AC
  Filled 2020-11-19: qty 10

## 2020-11-19 MED ORDER — DOCUSATE SODIUM 100 MG PO CAPS: 100.0000 mg | ORAL_CAPSULE | Freq: Two times a day (BID) | ORAL | 0 refills | Status: AC | PRN

## 2020-11-19 MED ORDER — LIDOCAINE HCL (PF) 2 % IJ SOLN
INTRAMUSCULAR | Status: AC
  Filled 2020-11-19: qty 5

## 2020-11-19 MED ORDER — MIDAZOLAM HCL 2 MG/2ML IJ SOLN
INTRAMUSCULAR | Status: DC | PRN
  Administered 2020-11-19: 2 mg via INTRAVENOUS

## 2020-11-19 MED ORDER — HYDROCODONE-ACETAMINOPHEN 5-325 MG PO TABS
1.0000 | ORAL_TABLET | Freq: Once | ORAL | Status: AC
Start: 2020-11-19 — End: 2020-11-19

## 2020-11-19 SURGICAL SUPPLY — 32 items
ADH SKN CLS APL DERMABOND .7 (GAUZE/BANDAGES/DRESSINGS) ×1
APL PRP STRL LF DISP 70% ISPRP (MISCELLANEOUS) ×2
BLADE SURG 15 STRL LF DISP TIS (BLADE) ×1 IMPLANT
BLADE SURG 15 STRL SS (BLADE) ×2
CHLORAPREP W/TINT 26 (MISCELLANEOUS) ×4 IMPLANT
COVER WAND RF STERILE (DRAPES) ×2 IMPLANT
DERMABOND ADVANCED (GAUZE/BANDAGES/DRESSINGS) ×1
DERMABOND ADVANCED .7 DNX12 (GAUZE/BANDAGES/DRESSINGS) ×1 IMPLANT
DRAPE 3/4 80X56 (DRAPES) IMPLANT
DRAPE LAPAROTOMY 100X77 ABD (DRAPES) ×2 IMPLANT
ELECT CAUTERY BLADE 6.4 (BLADE) ×2 IMPLANT
ELECT REM PT RETURN 9FT ADLT (ELECTROSURGICAL) ×2
ELECTRODE REM PT RTRN 9FT ADLT (ELECTROSURGICAL) ×1 IMPLANT
GLOVE SURG SYN 6.5 ES PF (GLOVE) ×4 IMPLANT
GLOVE SURG UNDER POLY LF SZ7 (GLOVE) ×4 IMPLANT
GOWN STRL REUS W/ TWL LRG LVL3 (GOWN DISPOSABLE) ×2 IMPLANT
GOWN STRL REUS W/TWL LRG LVL3 (GOWN DISPOSABLE) ×4
KIT TURNOVER KIT A (KITS) ×2 IMPLANT
LABEL OR SOLS (LABEL) ×2 IMPLANT
MANIFOLD NEPTUNE II (INSTRUMENTS) ×2 IMPLANT
NEEDLE HYPO 22GX1.5 SAFETY (NEEDLE) ×2 IMPLANT
NS IRRIG 1000ML POUR BTL (IV SOLUTION) ×2 IMPLANT
PACK BASIN MINOR ARMC (MISCELLANEOUS) ×2 IMPLANT
SUT ETHILON 3-0 FS-10 30 BLK (SUTURE)
SUT MNCRL 4-0 (SUTURE) ×2
SUT MNCRL 4-0 27XMFL (SUTURE) ×1
SUT VIC AB 3-0 SH 27 (SUTURE) ×2
SUT VIC AB 3-0 SH 27X BRD (SUTURE) ×1 IMPLANT
SUTURE EHLN 3-0 FS-10 30 BLK (SUTURE) IMPLANT
SUTURE MNCRL 4-0 27XMF (SUTURE) ×1 IMPLANT
SYR 30ML LL (SYRINGE) ×2 IMPLANT
TOWEL OR 17X26 4PK STRL BLUE (TOWEL DISPOSABLE) ×2 IMPLANT

## 2020-11-19 NOTE — Transfer of Care (Signed)
Immediate Anesthesia Transfer of Care Note  Patient: Kerry [name]  Procedure(s) Performed: EXCISION LIPOMA (Right Abdomen)  Patient Location: PACU  Anesthesia Type:General  Level of Consciousness: awake  Airway & Oxygen Therapy: Patient connected to face mask  Post-op Assessment: Report given to RN  Post vital signs: stable  Last Vitals:  Vitals Value Taken Time  BP 107/65 11/19/20 0937  Temp 36.2 C 11/19/20 0937  Pulse 87 11/19/20 0941  Resp 11 11/19/20 0941  SpO2 100 % 11/19/20 0941  Vitals shown include unvalidated device data.  Last Pain:  Vitals:   11/19/20 0759  PainSc: 0-No pain         Complications: No complications documented.

## 2020-11-19 NOTE — Op Note (Signed)
Pre-Op Dx: Abdominal wall mass Post-Op Dx: Same Anesthesia: GETA EBL: 10 mL Complications:  none apparent Specimen: Abdominal wall mass Procedure: excisional biopsy of abdominal wall mass Surgeon: Lysle Pearl  Indications for procedure: See H&P  Description of Procedure:  Consent obtained, time out performed.  Patient placed in supine position.  Area sterilized and draped in usual position.  Local infused to area previously marked.  5 cm incision made through dermis with 15blade and abdominal wall mass noted in subcutaneous layer.  The  3 cm x 2 cm x 2 cm mass then removed from surrounding tissue completely using electrocautery, passed off field pending pathology.  Wound hemostasis noted, then closed in two layer fashion with 3-0 vicryl in interrupted fashion for deep dermal layer, then running 4-0 monocryl in subcuticular fashion for epidermal layer.  Wound then dressed with dermabond.  Pt tolerated procedure well, and transferred to PACU in stable condition. Sponge and instrument count correct at end of procedure.

## 2020-11-19 NOTE — Anesthesia Procedure Notes (Signed)
Procedure Name: Intubation Date/Time: 11/19/2020 8:45 AM Performed by: Zetta Bills, CRNA Pre-anesthesia Checklist: Patient identified, Emergency Drugs available, Suction available and Patient being monitored Patient Re-evaluated:Patient Re-evaluated prior to induction Oxygen Delivery Method: Circle system utilized Preoxygenation: Pre-oxygenation with 100% oxygen Induction Type: IV induction Ventilation: Mask ventilation without difficulty Laryngoscope Size: 4 and McGraph Grade View: Grade I Tube type: Oral Tube size: 7.0 mm Airway Equipment and Method: Stylet and Video-laryngoscopy Placement Confirmation: ETT inserted through vocal cords under direct vision Secured at: 21 cm Tube secured with: Tape Dental Injury: Teeth and Oropharynx as per pre-operative assessment

## 2020-11-19 NOTE — Discharge Instructions (Signed)
AMBULATORY SURGERY  °DISCHARGE INSTRUCTIONS ° ° °1) The drugs that you were given will stay in your system until tomorrow so for the next 24 hours you should not: ° °A) Drive an automobile °B) Make any legal decisions °C) Drink any alcoholic beverage ° ° °2) You may resume regular meals tomorrow.  Today it is better to start with liquids and gradually work up to solid foods. ° °You may eat anything you prefer, but it is better to start with liquids, then soup and crackers, and gradually work up to solid foods. ° ° °3) Please notify your doctor immediately if you have any unusual bleeding, trouble breathing, redness and pain at the surgery site, drainage, fever, or pain not relieved by medication. ° ° ° °4) Additional Instructions: ° ° ° ° ° ° ° °Please contact your physician with any problems or Same Day Surgery at [phone], Monday through Friday 6 am to 4 pm, or [place] [facility] [place] Main number at [phone].Removal, Care After °This sheet gives you information about how to care for yourself after your procedure. Your health care provider may also give you more specific instructions. If you have problems or questions, contact your health care provider. °What can I expect after the procedure? °After the procedure, it is common to have: °· Soreness. °· Bruising. °· Itching. °Follow these instructions at home: °site care °Follow instructions from your health care provider about how to take care of your site. Make sure you: °· Wash your hands with soap and water before and after you change your bandage (dressing). If soap and water are not available, use hand sanitizer. °· Leave stitches (sutures), skin glue, or adhesive strips in place. These skin closures may need to stay in place for 2 weeks or longer. If adhesive strip edges start to loosen and curl up, you may trim the loose edges. Do not remove adhesive strips completely unless your health care provider tells you to do that. °· If the area bleeds or  bruises, apply gentle pressure for 10 minutes. °· OK TO SHOWER IN 24HRS ° °Check your site every day for signs of infection. Check for: °· Redness, swelling, or pain. °· Fluid or blood. °· Warmth. °· Pus or a bad smell. ° °General instructions °· Rest and then return to your normal activities as told by your health care provider. °•  tylenol and advil as needed for discomfort.  Please alternate between the two every four hours as needed for pain.   °•  Use narcotics, if prescribed, only when tylenol and motrin is not enough to control pain. °•  325-650mg every 8hrs to max of 3000mg/24hrs (including the 325mg in every norco dose) for the tylenol.   °•  Advil up to 800mg per dose every 8hrs as needed for pain.   °· Keep all follow-up visits as told by your health care provider. This is important. °Contact a health care provider if: °· You have redness, swelling, or pain around your site. °· You have fluid or blood coming from your site. °· Your site feels warm to the touch. °· You have pus or a bad smell coming from your site. °· You have a fever. °· Your sutures, skin glue, or adhesive strips loosen or come off sooner than expected. °Get help right away if: °· You have bleeding that does not stop with pressure or a dressing. °Summary °· After the procedure, it is common to have some soreness, bruising, and itching at the site. °·   Follow instructions from your health care provider about how to take care of your site. °· Check your site every day for signs of infection. °· Contact a health care provider if you have redness, swelling, or pain around your site, or your site feels warm to the touch. °· Keep all follow-up visits as told by your health care provider. This is important. °This information is not intended to replace advice given to you by your health care provider. Make sure you discuss any questions you have with your health care provider. °Document Released: 07/23/2015 Document Revised: 12/24/2017 Document  Reviewed: 12/24/2017 °Elsevier Interactive Patient Education © 2019 [employer] ° ° ° °

## 2020-11-19 NOTE — Anesthesia Preprocedure Evaluation (Addendum)
Anesthesia Evaluation  Patient identified by MRN, date of birth, ID band Patient awake    Reviewed: Allergy & Precautions, NPO status , Patient's Chart, lab work & pertinent test results  History of Anesthesia Complications Negative for: history of anesthetic complications  Airway Mallampati: I       Dental   Pulmonary neg sleep apnea, neg COPD, Not current smoker,           Cardiovascular (-) hypertension(-) Past MI and (-) CHF (-) dysrhythmias (-) Valvular Problems/Murmurs     Neuro/Psych neg Seizures Anxiety Depression    GI/Hepatic Neg liver ROS, neg GERD  ,  Endo/Other  neg diabetes  Renal/GU negative Renal ROS     Musculoskeletal   Abdominal   Peds  Hematology   Anesthesia Other Findings   Reproductive/Obstetrics                             Anesthesia Physical Anesthesia Plan  ASA: I  Anesthesia Plan: General   Post-op Pain Management:    Induction: Intravenous  PONV Risk Score and Plan: 3 and Ondansetron, Dexamethasone and Treatment may vary due to age or medical condition  Airway Management Planned: Oral ETT and LMA  Additional Equipment:   Intra-op Plan:   Post-operative Plan:   Informed Consent: I have reviewed the patients History and Physical, chart, labs and discussed the procedure including the risks, benefits and alternatives for the proposed anesthesia with the patient or authorized representative who has indicated his/her understanding and acceptance.       Plan Discussed with:   Anesthesia Plan Comments:        Anesthesia Quick Evaluation

## 2020-11-19 NOTE — Interval H&P Note (Signed)
History and Physical Interval Note:  11/19/2020 8:50 AM  Aletha Halim  has presented today for surgery, with the diagnosis of Right lower quadrant abdominal mass R19.03.  The various methods of treatment have been discussed with the patient and family. After consideration of risks, benefits and other options for treatment, the patient has consented to  Procedure(s): EXCISION LIPOMA (Right) as a surgical intervention.  The patient's history has been reviewed, patient examined, no change in status, stable for surgery.  I have reviewed the patient's chart and labs.  Questions were answered to the patient's satisfaction.     Jhamir Pickup Lysle Pearl

## 2020-11-22 ENCOUNTER — Encounter: Payer: Self-pay | Admitting: Surgery

## 2020-11-22 LAB — SURGICAL PATHOLOGY

## 2020-11-22 NOTE — Anesthesia Postprocedure Evaluation (Signed)
Anesthesia Post Note  Patient: Kerry [name]  Procedure(s) Performed: EXCISION LIPOMA (Right Abdomen)  Patient location during evaluation: PACU Anesthesia Type: General Level of consciousness: awake and alert and oriented Pain management: pain level controlled Vital Signs Assessment: post-procedure vital signs reviewed and stable Respiratory status: spontaneous breathing Cardiovascular status: blood pressure returned to baseline Anesthetic complications: no   No complications documented.   Last Vitals:  Vitals:   11/19/20 1015 11/19/20 1028  BP: 114/62 (!) 102/52  Pulse: 85 78  Resp: 13 14  Temp: (!) 36.4 C (!) 36.3 C  SpO2: 94% 100%    Last Pain:  Vitals:   11/20/20 1220  TempSrc:   PainSc: 3                  [name]

## 2021-08-18 IMAGING — US US PELVIS COMPLETE TRANSABD/TRANSVAG W DUPLEX
1 series · 13 of 25 positions shown · non-contrast
Comparison: None.

CLINICAL DATA: Vaginal bleeding for several days

EXAM:
TRANSABDOMINAL AND TRANSVAGINAL ULTRASOUND OF PELVIS
DOPPLER ULTRASOUND OF OVARIES
TECHNIQUE: Both transabdominal and transvaginal ultrasound examinations of the
pelvis were performed. Transabdominal technique was performed for
global imaging of the pelvis including uterus, ovaries, adnexal
regions, and pelvic cul-de-sac.
It was necessary to proceed with endovaginal exam following the
transabdominal exam to visualize the ovaries. Color and duplex
Doppler ultrasound was utilized to evaluate blood flow to the
ovaries.

[Series 1: us pelvic complete w transvaginal and torsion righ · 13 of 101 slices shown]
[im 1/101]
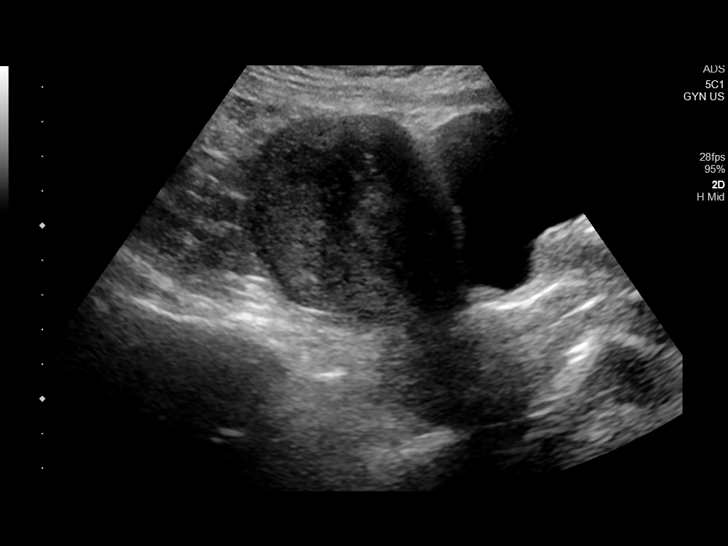
[im 9/101]
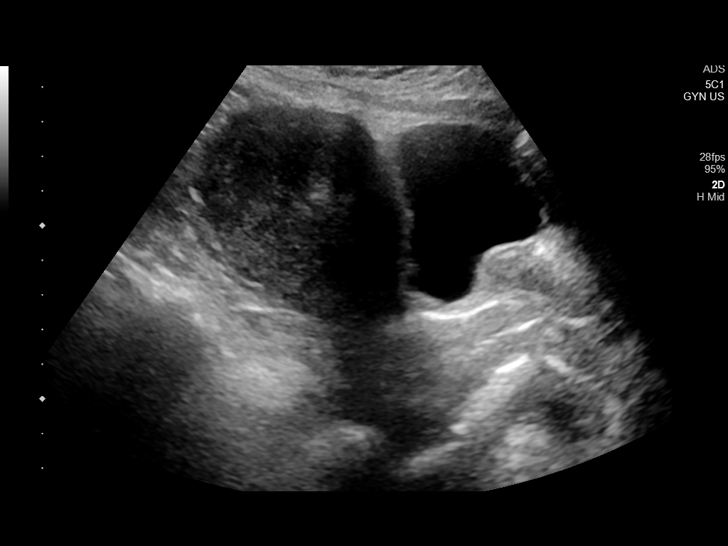
[im 17/101]
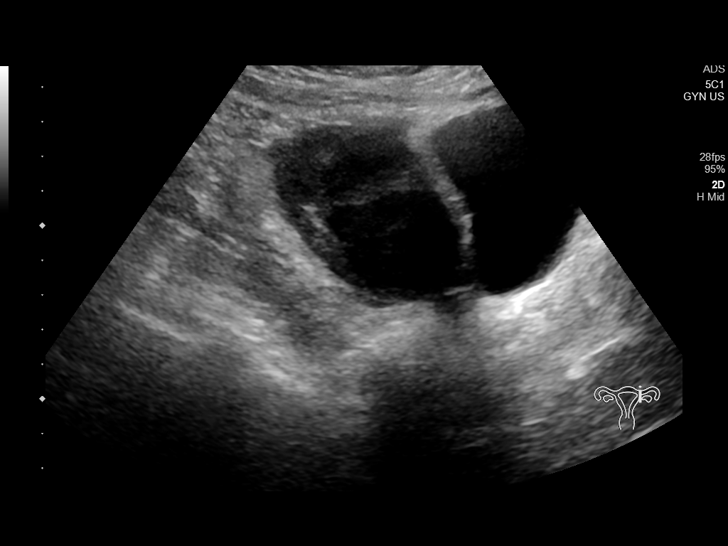
[im 26/101]
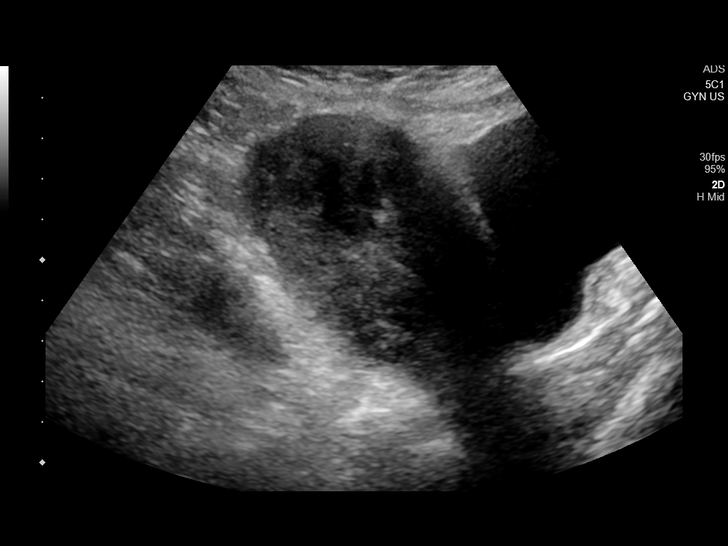
[im 34/101]
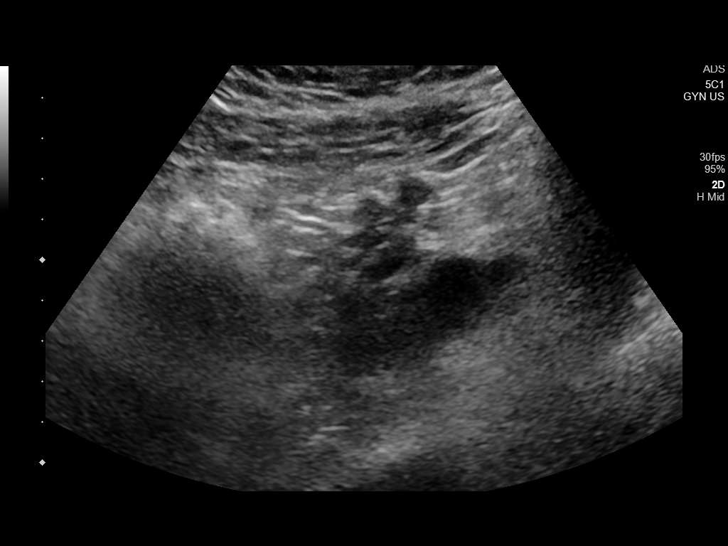
[im 42/101]
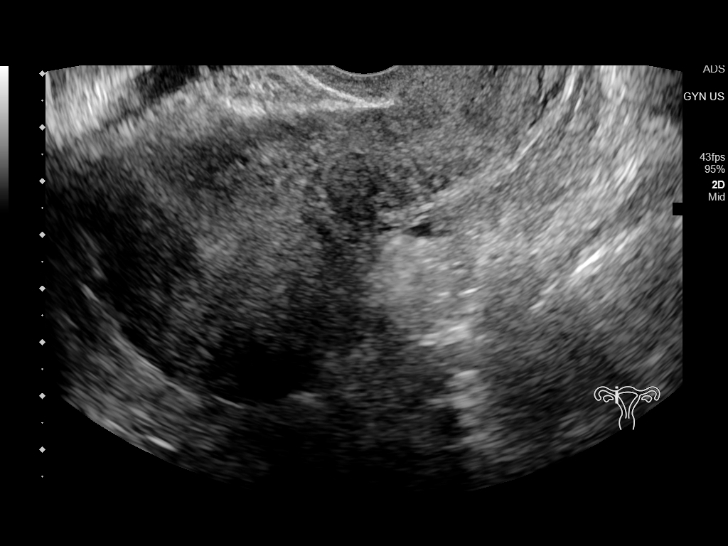
[im 51/101]
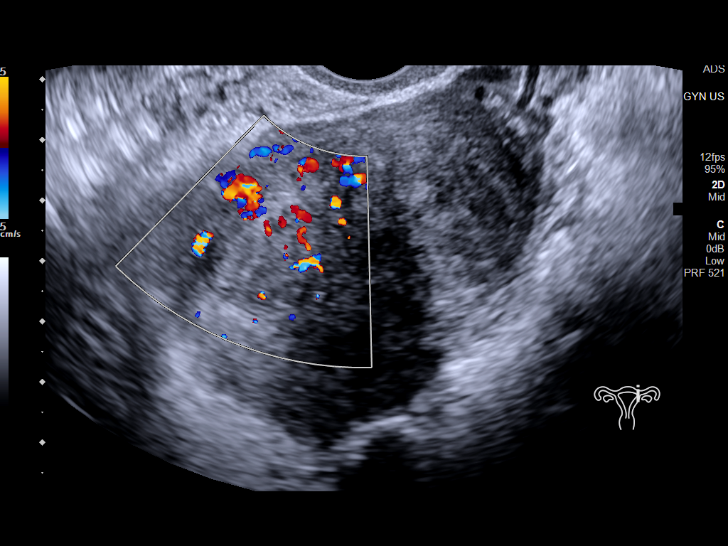
[im 59/101]
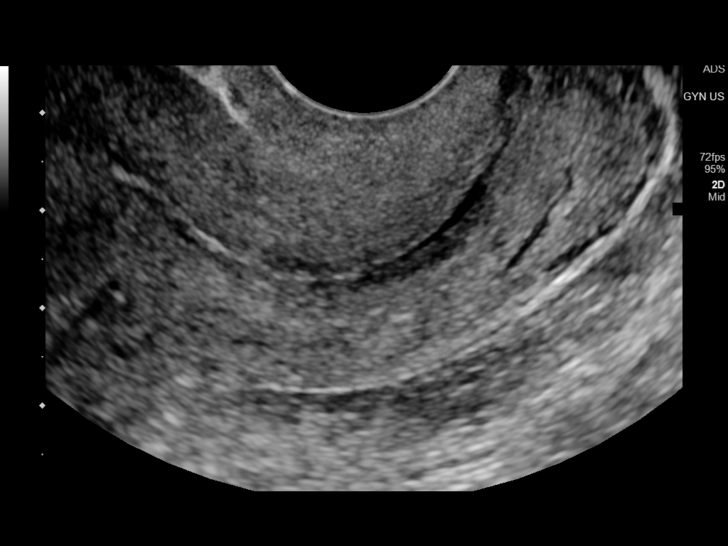
[im 67/101]
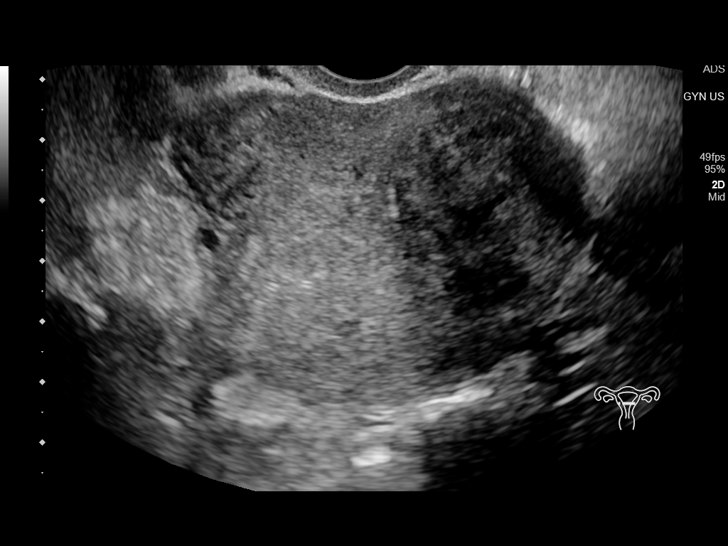
[im 76/101]
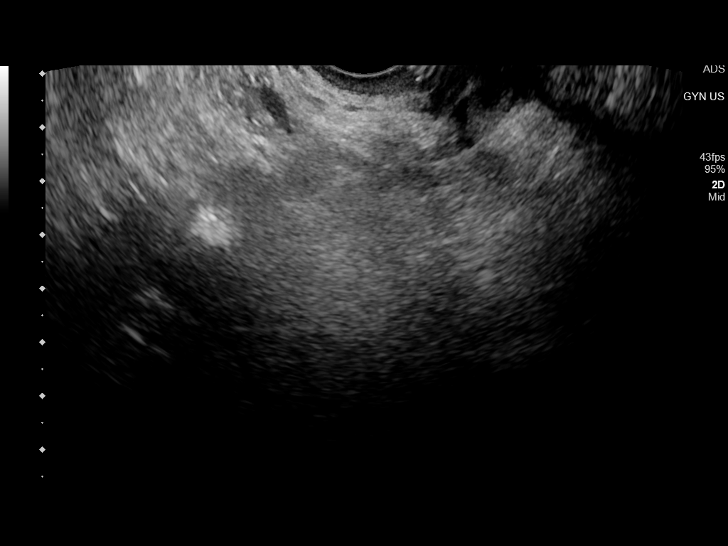
[im 84/101]
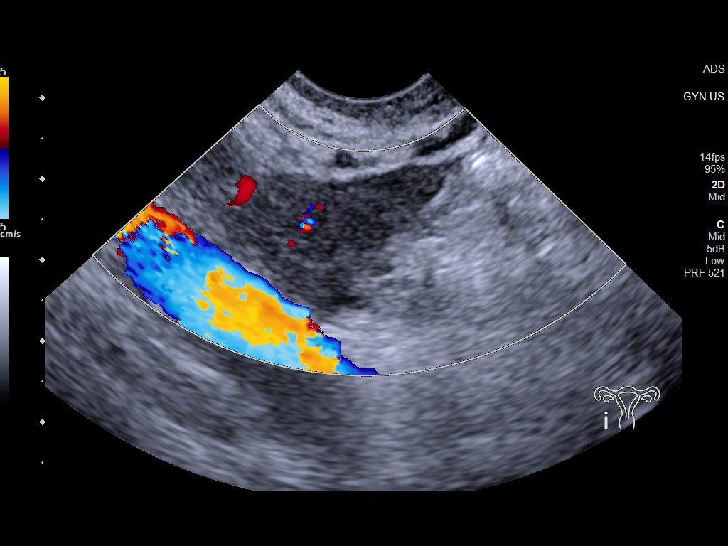
[im 92/101]
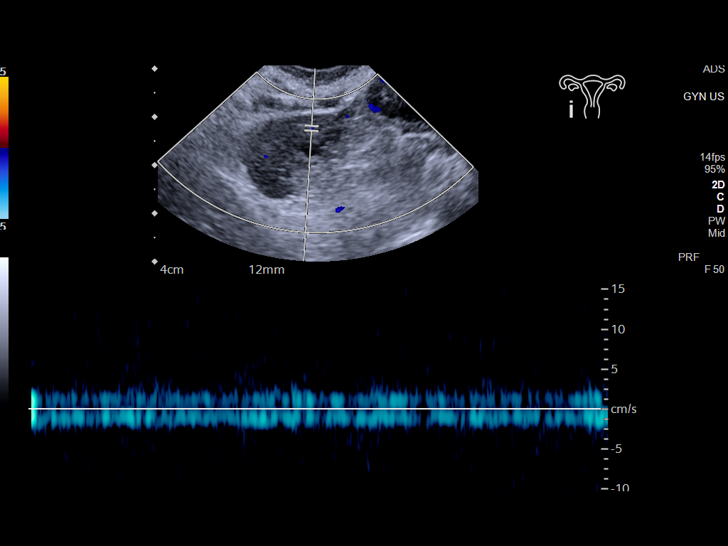
[im 101/101]
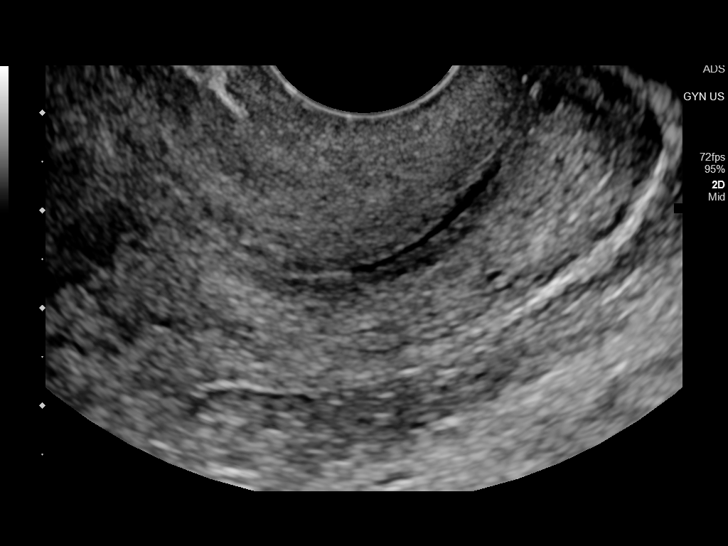

[13 of 25 positions shown; findings below may reference images not displayed]

FINDINGS: Uterus

Measurements: 10.5 x 5.8 x 6.7 cm. = volume: 212 mL. Multiple
uterine fibroids are seen. The largest of these measures 3.3 cm.
These are scattered throughout the substance of the uterus in
subserosal location as well as in the lower uterine segment near the
cervix.

Endometrium

Thickness: 4.5 mm.  No focal abnormality visualized.

Right ovary

Measurements: 2.5 x 1.8 x 2.4 cm. = volume: 5.6 mL. Normal
appearance/no adnexal mass.

Left ovary

Not well visualized.

Pulsed Doppler evaluation of the right ovary demonstrates normal
low-resistance arterial and venous waveforms.

Other findings

No abnormal free fluid.
IMPRESSION: Multiple uterine fibroids as described.

## 2021-09-19 ENCOUNTER — Other Ambulatory Visit: Payer: Self-pay | Admitting: Podiatry

## 2021-09-26 ENCOUNTER — Encounter: Payer: Self-pay | Admitting: Podiatry

## 2021-09-29 NOTE — Discharge Instructions (Addendum)
[facility] ? ?POST OPERATIVE INSTRUCTIONS FOR DR. Vickki Muff AND DR. Christie Viscomi ?[place] ? ? ?Take your medication as prescribed.  Pain medication should be taken only as needed.  You may also take ibuprofen or Tylenol between pain medication doses.  If pain is still severe then you may take 1 pain tablet every 4 hours.  If still severe then you may take 2 pain tablets every 6 hours.  If still severe then you may take 2 pain tablets every 4 hours.  If still very significant pain then contact physician or office. ? ?Keep the dressing clean, dry and intact. ? ?Keep your foot elevated above the heart level for the first 48 hours.  Continue elevation thereafter to improve swelling.  Also recommend icing for maximum 10 minutes out of every 1 hour as needed on the top of your foot and side of the foot. ? ?Continue nonweightbearing at all times to right lower extremity. ? ?Always wear your post-op shoe when walking.  Always use your crutches, knee scooter, or wheelchair if you are to be non-weight bearing. ? ?Do not take a shower. Baths are permissible as long as the foot is kept out of the water.  ? ?Every hour you are awake:  ?Bend your knee 15 times. ?Massage calf 15 times ? ?Call [facility] [phone]) if any of the following problems occur: ?You develop a temperature or fever. ?The bandage becomes saturated with blood. ?Medication does not stop your pain. ?Injury of the foot occurs. ?Any symptoms of infection including redness, odor, or red streaks running from wound.  ? ? ? ?PERIPHERAL NERVE BLOCK PATIENT INFORMATION ? ?Your surgeon has requested a peripheral nerve block for your surgery. This anesthetic technique provides excellent post-operative pain relief for you in a safe and effective manner. It will also help reduce the risk of nausea and vomiting and allow earlier discharge from the hospital.   ?The block is performed under sedation with  ultrasound guidance prior to your procedure. Due to the sedation, your may or may not remember the block experience. The nerve block will begin to take effect anywhere from 5 to 30 minutes after being administered. You will be transported to the operating room from your surgery after the block is completed.   ?At the end of surgery, when the anesthesia wears off, you will notice a few things. Your may not be able to move or feel the part of your body targeted by the nerve block. These are normal experiences, and they will disappear as the block wears off.  ?If you had an interscalene nerve block performed (which is common for shoulder surgery), your voice can be very hoarse and you may feel that you are not able to take as deep a breath as you did before surgery. Some patients may also notice a droopy eyelid on the affected side. These symptoms will resolve once the block wears off.  ?Pain control: ?The nerve block technique used is a single injection that can last anywhere from 1-3 days. The duration of the numbness can vary between individuals. After leaving the hospital, it is important that you begin to take your prescribed pain medication when you start to sense the nerve block wearing off. This will help you avoid unpleasant pain at the time the nerve block wears off, which can sometimes be in the middle of the night. The block will only cover pain in the areas targeted by the nerve block  so if you experience surgical pain outside of that area, please take your prescribed pain medication. ?Management of the ?numb area?: ?After a nerve block, you cannot feel pain, pressure, or temperature in the affected area so there is an increased risk for injury. You should take extra care to protect the affected areas until sensation and movement returns. Please take caution to not come in contact with extremely hot or cold items because you will not be able to sense or protect yourself form the extremes of temperature.   You may experience some persistent numbness after the procedure by most neurological deficits resolve over time and the incidence of serious long term neurological complications attributable to peripheral nerve blocks are relatively uncommon.    ? ? ? ?Information for Discharge Teaching: ?EXPAREL (bupivacaine liposome injectable suspension)  ? ?Your surgeon or anesthesiologist gave you EXPAREL(bupivacaine) to help control your pain after surgery.  ?EXPAREL is a local anesthetic that provides pain relief by numbing the tissue around the surgical site. ?EXPAREL is designed to release pain medication over time and can control pain for up to 72 hours. ?Depending on how you respond to EXPAREL, you may require less pain medication during your recovery. ? ?Possible side effects: ?Temporary loss of sensation or ability to move in the area where bupivacaine was injected. ?Nausea, vomiting, constipation ?Rarely, numbness and tingling in your mouth or lips, lightheadedness, or anxiety may occur. ?Call your doctor right away if you think you may be experiencing any of these sensations, or if you have other questions regarding possible side effects. ? ?Follow all other discharge instructions given to you by your surgeon or nurse. Eat a healthy diet and drink plenty of water or other fluids. ? ?If you return to the hospital for any reason within 96 hours following the administration of EXPAREL, it is important for health care providers to know that you have received this anesthetic. A teal colored band has been placed on your arm with the date, time and amount of EXPAREL you have received in order to alert and inform your health care providers. Please leave this armband in place for the full 96 hours following administration, and then you may remove the band.  ? ? ?

## 2021-10-06 ENCOUNTER — Ambulatory Visit: Payer: No Typology Code available for payment source | Admitting: Anesthesiology

## 2021-10-06 ENCOUNTER — Encounter: Payer: Self-pay | Admitting: Podiatry

## 2021-10-06 ENCOUNTER — Other Ambulatory Visit: Payer: Self-pay

## 2021-10-06 ENCOUNTER — Ambulatory Visit: Payer: Self-pay

## 2021-10-06 ENCOUNTER — Encounter
Admission: RE | Disposition: A | Discharge status: Home / Self Care | Payer: Self-pay | Source: Home / Self Care | Attending: Podiatry

## 2021-10-06 ENCOUNTER — Ambulatory Visit
Admission: RE | Admit: 2021-10-06 | Discharge: 2021-10-06 | Disposition: A | Discharge status: Home / Self Care | Payer: No Typology Code available for payment source | Attending: Podiatry | Admitting: Podiatry

## 2021-10-06 DIAGNOSIS — M76829 Posterior tibial tendinitis, unspecified leg: Secondary | ICD-10-CM | POA: Diagnosis present

## 2021-10-06 DIAGNOSIS — M2142 Flat foot [pes planus] (acquired), left foot: Secondary | ICD-10-CM | POA: Insufficient documentation

## 2021-10-06 DIAGNOSIS — M2141 Flat foot [pes planus] (acquired), right foot: Secondary | ICD-10-CM | POA: Diagnosis not present

## 2021-10-06 DIAGNOSIS — G8929 Other chronic pain: Secondary | ICD-10-CM | POA: Diagnosis not present

## 2021-10-06 DIAGNOSIS — M659 Synovitis and tenosynovitis, unspecified: Secondary | ICD-10-CM | POA: Diagnosis not present

## 2021-10-06 DIAGNOSIS — M216X2 Other acquired deformities of left foot: Secondary | ICD-10-CM | POA: Diagnosis not present

## 2021-10-06 DIAGNOSIS — M216X1 Other acquired deformities of right foot: Secondary | ICD-10-CM | POA: Diagnosis not present

## 2021-10-06 HISTORY — PX: GASTROC RECESSION EXTREMITY: SHX6262

## 2021-10-06 HISTORY — PX: CALCANEAL OSTEOTOMY: SHX1281

## 2021-10-06 HISTORY — PX: POSTERIOR TIBIAL TENDON REPAIR: SHX6039

## 2021-10-06 SURGERY — RECESSION, TENDON, GASTROCNEMIUS
Anesthesia: Regional | Laterality: Right

## 2021-10-06 MED ORDER — MIDAZOLAM HCL 5 MG/5ML IJ SOLN
INTRAMUSCULAR | Status: DC | PRN
  Administered 2021-10-06: 2 mg via INTRAVENOUS

## 2021-10-06 MED ORDER — ONDANSETRON HCL 4 MG PO TABS: 4.0000 mg | ORAL_TABLET | Freq: Three times a day (TID) | ORAL | 0 refills | Status: DC | PRN

## 2021-10-06 MED ORDER — SODIUM CHLORIDE 0.9 % IR SOLN
Status: DC | PRN
  Administered 2021-10-06: 250 mL

## 2021-10-06 MED ORDER — AMOXICILLIN-POT CLAVULANATE 875-125 MG PO TABS: 1.0000 | ORAL_TABLET | Freq: Two times a day (BID) | ORAL | 0 refills | Status: AC

## 2021-10-06 MED ORDER — FENTANYL CITRATE (PF) 100 MCG/2ML IJ SOLN
INTRAMUSCULAR | Status: DC | PRN
  Administered 2021-10-06: 100 ug via INTRAVENOUS

## 2021-10-06 MED ORDER — LACTATED RINGERS IV SOLN: INTRAVENOUS | Status: DC

## 2021-10-06 MED ORDER — OXYCODONE HCL 5 MG PO TABS
5.0000 mg | ORAL_TABLET | Freq: Once | ORAL | Status: AC | PRN
  Administered 2021-10-06: 5 mg via ORAL

## 2021-10-06 MED ORDER — BUPIVACAINE LIPOSOME 1.3 % IJ SUSP
INTRAMUSCULAR | Status: DC | PRN
  Administered 2021-10-06: 7.5 mL
  Administered 2021-10-06: 12.5 mL

## 2021-10-06 MED ORDER — CEFAZOLIN SODIUM-DEXTROSE 2-4 GM/100ML-% IV SOLN
2.0000 g | INTRAVENOUS | Status: AC
  Administered 2021-10-06: 2 g via INTRAVENOUS

## 2021-10-06 MED ORDER — BUPIVACAINE HCL (PF) 0.5 % IJ SOLN
INTRAMUSCULAR | Status: DC | PRN
  Administered 2021-10-06: 7.5 mL
  Administered 2021-10-06: 12.5 mL

## 2021-10-06 MED ORDER — 0.9 % SODIUM CHLORIDE (POUR BTL) OPTIME
TOPICAL | Status: DC | PRN
  Administered 2021-10-06: 1000 mL

## 2021-10-06 MED ORDER — PROPOFOL 10 MG/ML IV BOLUS
INTRAVENOUS | Status: DC | PRN
  Administered 2021-10-06: 100 mg via INTRAVENOUS
  Administered 2021-10-06: 140 ug/kg/min via INTRAVENOUS

## 2021-10-06 MED ORDER — OXYCODONE HCL 5 MG/5ML PO SOLN: 5.0000 mg | Freq: Once | ORAL | Status: AC | PRN

## 2021-10-06 MED ORDER — OXYCODONE-ACETAMINOPHEN 5-325 MG PO TABS: 1.0000 | ORAL_TABLET | Freq: Four times a day (QID) | ORAL | 0 refills | Status: AC | PRN

## 2021-10-06 MED ORDER — ASPIRIN EC 81 MG PO TBEC: 81.0000 mg | DELAYED_RELEASE_TABLET | Freq: Two times a day (BID) | ORAL | 0 refills | Status: AC

## 2021-10-06 MED ORDER — LIDOCAINE HCL (CARDIAC) PF 100 MG/5ML IV SOSY
PREFILLED_SYRINGE | INTRAVENOUS | Status: DC | PRN
Start: 2021-10-06 — End: 2021-10-06
  Administered 2021-10-06: 30 mg via INTRATRACHEAL

## 2021-10-06 MED ORDER — ONDANSETRON HCL 4 MG/2ML IJ SOLN
INTRAMUSCULAR | Status: DC | PRN
  Administered 2021-10-06: 4 mg via INTRAVENOUS

## 2021-10-06 SURGICAL SUPPLY — 59 items
ANCH SUT NDL DX FBRTK STRL LF (Anchor) ×1 IMPLANT
ANCHOR SUT FBRTK 1.3 SUTTAP (Anchor) ×1 IMPLANT
BIT DRILL 2.4X140 LONG SOLID (BIT) ×1 IMPLANT
BIT DRILL 2.8 (BIT) ×2
BIT DRILL LNG 140X2.8XSLD (BIT) IMPLANT
BIT DRL LNG 140X2.8XSLD (BIT) ×1
BLADE MINI RND TIP GREEN BEAV (BLADE) ×2 IMPLANT
BLADE OSCILLATING/SAGITTAL (BLADE) ×2
BLADE SURG 15 STRL LF DISP TIS (BLADE) IMPLANT
BLADE SURG 15 STRL SS (BLADE) ×4
BLADE SW THK.38XMED LNG THN (BLADE) IMPLANT
BNDG ESMARK 4X12 TAN STRL LF (GAUZE/BANDAGES/DRESSINGS) ×2 IMPLANT
CANISTER SUCT 1200ML W/VALVE (MISCELLANEOUS) ×2 IMPLANT
COVER LIGHT HANDLE UNIVERSAL (MISCELLANEOUS) ×4 IMPLANT
CUFF TOURN SGL QUICK 30 (TOURNIQUET CUFF) ×2
CUFF TOURN SGL QUICK 34 (TOURNIQUET CUFF) ×2
CUFF TRNQT CYL 30X4X21-28X (TOURNIQUET CUFF) ×1 IMPLANT
CUFF TRNQT CYL 34X4X40X1 (TOURNIQUET CUFF) ×1 IMPLANT
DRAPE FLUOR MINI C-ARM 54X84 (DRAPES) ×1 IMPLANT
DRSG TEGADERM 4X4.75 (GAUZE/BANDAGES/DRESSINGS) ×1 IMPLANT
DRSG TELFA 4X3 1S NADH ST (GAUZE/BANDAGES/DRESSINGS) ×1 IMPLANT
DURAPREP 26ML APPLICATOR (WOUND CARE) ×2 IMPLANT
ELECT REM PT RETURN 9FT ADLT (ELECTROSURGICAL) ×2
ELECTRODE REM PT RTRN 9FT ADLT (ELECTROSURGICAL) ×1 IMPLANT
ETHIBOND 2 0 GREEN CT 2 30IN (SUTURE) ×1 IMPLANT
GAUZE SPONGE 4X4 12PLY STRL (GAUZE/BANDAGES/DRESSINGS) ×2 IMPLANT
GAUZE XEROFORM 1X8 LF (GAUZE/BANDAGES/DRESSINGS) ×2 IMPLANT
GLOVE BIO SURGEON STRL SZ7 (GLOVE) ×2 IMPLANT
GLOVE SURG POLYISO LF SZ7 (GLOVE) ×2 IMPLANT
GLOVE SURG UNDER POLY LF SZ7 (GLOVE) ×4 IMPLANT
GOWN STRL REUS W/ TWL XL LVL3 (GOWN DISPOSABLE) ×1 IMPLANT
GOWN STRL REUS W/TWL XL LVL3 (GOWN DISPOSABLE) ×2
IV NS 250ML (IV SOLUTION) ×2
IV NS 250ML BAXH (IV SOLUTION) IMPLANT
K-WIRE SINGLE TROCAR 2.3X230 (WIRE) ×4
KIT FIBERTAK DX 1.6 DISP (KITS) ×1 IMPLANT
KIT TURNOVER KIT A (KITS) ×2 IMPLANT
KWIRE SINGLE TROCAR 2.3X230 (WIRE) IMPLANT
NS IRRIG 500ML POUR BTL (IV SOLUTION) ×2 IMPLANT
PACK EXTREMITY ARMC (MISCELLANEOUS) ×2 IMPLANT
PADDING CAST BLEND 4X4 NS (MISCELLANEOUS) ×2 IMPLANT
PENCIL SMOKE EVACUATOR (MISCELLANEOUS) ×2 IMPLANT
PLATE CALC SLIDE (Plate) ×1 IMPLANT
SCREW LOCK PLATE R3 3.5X22 (Screw) ×1 IMPLANT
SCREW LOCK PLATE R3 3.5X24 (Screw) ×1 IMPLANT
SCREW NONLOCK PLATE R3 4.2X24 (Screw) ×2 IMPLANT
SPONGE T-LAP 18X18 ~~LOC~~+RFID (SPONGE) ×1 IMPLANT
STAPLER SKIN PROX 35W (STAPLE) ×1 IMPLANT
STOCKINETTE IMPERVIOUS LG (DRAPES) ×2 IMPLANT
STRIP CLOSURE SKIN 1/4X4 (GAUZE/BANDAGES/DRESSINGS) ×2 IMPLANT
SUT ETHILON 5-0 FS-2 18 BLK (SUTURE) ×2 IMPLANT
SUT VIC AB 2-0 SH 27 (SUTURE) ×2
SUT VIC AB 2-0 SH 27XBRD (SUTURE) IMPLANT
SUT VIC AB 3-0 SH 27 (SUTURE) ×8
SUT VIC AB 3-0 SH 27X BRD (SUTURE) IMPLANT
SUT VIC AB 4-0 FS2 27 (SUTURE) ×2 IMPLANT
WAND TENDON TOPAZ 0 ANGL (MISCELLANEOUS) ×1 IMPLANT
WAND TOPAZ MICRO DEBRIDER (MISCELLANEOUS) ×1 IMPLANT
WEDGE EVANS CANC FEM 6 (Miscellaneous) ×1 IMPLANT

## 2021-10-06 NOTE — Anesthesia Procedure Notes (Signed)
Anesthesia Regional Block: Popliteal block  ? ?Pre-Anesthetic Checklist: , timeout performed,  Correct Patient, Correct Site, Correct Laterality,  Correct Procedure, Correct Position, site marked,  Risks and benefits discussed,  Surgical consent,  Pre-op evaluation,  At surgeon's request and post-op pain management ? ?Laterality: Right ? ?Prep: chloraprep     ?  ?Needles:  ?Injection technique: Single-shot ? ?Needle Type: Echogenic Needle   ? ? ?Needle Length: 9cm  ?Needle Gauge: 21  ? ? ? ?Additional Needles: ? ? ?Procedures:,,,, ultrasound used (permanent image in chart),,    ?Narrative:  ?Start time: 10/06/2021 8:05 AM ?End time: 10/06/2021 8:10 AM ?Injection made incrementally with aspirations every 5 mL. ? ?Performed by: Personally  ? ?Additional Notes: ?Functioning IV was confirmed and monitors applied. Ultrasound guidance: relevant anatomy identified, needle position confirmed, local anesthetic spread visualized around nerve(s)., vascular puncture avoided.  Image printed for medical record.  Negative aspiration and no paresthesias; incremental administration of local anesthetic. The patient tolerated the procedure well. Vitals signes recorded in RN notes. ? ? ? ?

## 2021-10-06 NOTE — Transfer of Care (Signed)
Immediate Anesthesia Transfer of Care Note ? ?Patient: Kerry [name] ? ?Procedure(s) Performed: GASTROC RECESSION (Right) ?EVANS/MCDO X 2 (Right) ?FLEXOR TENDON REPAIR - SECOND - POSTERIOR TIBIAL TENDON REPAIR (Right) ? ?Patient Location: PACU ? ?Anesthesia Type: General, Regional ? ?Level of Consciousness: awake, alert  and patient cooperative ? ?Airway and Oxygen Therapy: Patient Spontanous Breathing and Patient connected to supplemental oxygen ? ?Post-op Assessment: Post-op Vital signs reviewed, Patient's Cardiovascular Status Stable, Respiratory Function Stable, Patent Airway and No signs of Nausea or vomiting ? ?Post-op Vital Signs: Reviewed and stable ? ?Complications: No notable events documented. ? ?

## 2021-10-06 NOTE — Progress Notes (Signed)
Assisted [place]rene [place] Page ANMD with right, popliteal/saphenous, ultrasound guided block. Side rails up, monitors on throughout procedure. See vital signs in flow sheet. Tolerated Procedure well. ?

## 2021-10-06 NOTE — Anesthesia Preprocedure Evaluation (Addendum)
Anesthesia Evaluation  ?Patient identified by MRN, date of birth, ID band ?Patient awake ? ? ? ?History of Anesthesia Complications ?Negative for: history of anesthetic complications ? ?Airway ?Mallampati: I ? ?TM Distance: >3 FB ?Neck ROM: Full ? ? ? Dental ?no notable dental hx. ? ?  ?Pulmonary ?neg pulmonary ROS,  ?  ?Pulmonary exam normal ? ? ? ? ? ? ? Cardiovascular ?Exercise Tolerance: Good ?negative cardio ROS ?Normal cardiovascular exam ? ? ?  ?Neuro/Psych ?negative neurological ROS ?   ? GI/Hepatic ?negative GI ROS, Neg liver ROS,   ?Endo/Other  ?BMI 37 ? Renal/GU ?negative Renal ROS  ? ?  ?Musculoskeletal ? ? Abdominal ?  ?Peds ? Hematology ?negative hematology ROS ?(+)   ?Anesthesia Other Findings ? ? Reproductive/Obstetrics ? ?  ? ? ? ? ? ? ? ? ? ? ? ? ? ?  ?  ? ? ? ? ? ? ? ?Anesthesia Physical ?Anesthesia Plan ? ?ASA: 2 ? ?Anesthesia Plan: General and Regional  ? ?Post-op Pain Management: Regional block, Ofirmev IV (intra-op) and Toradol IV (intra-op)  ? ?Induction: Intravenous ? ?PONV Risk Score and Plan: 3 and Propofol infusion, TIVA, Midazolam and Treatment may vary due to age or medical condition ? ?Airway Management Planned: Natural Airway and Simple Face Mask ? ?Additional Equipment: None ? ?Intra-op Plan:  ? ?Post-operative Plan:  ? ?Informed Consent: I have reviewed the patients History and Physical, chart, labs and discussed the procedure including the risks, benefits and alternatives for the proposed anesthesia with the patient or authorized representative who has indicated his/her understanding and acceptance.  ? ? ? ? ? ?Plan Discussed with: CRNA ? ?Anesthesia Plan Comments:   ? ? ? ? ? ? ?Anesthesia Quick Evaluation ? ?

## 2021-10-06 NOTE — H&P (Signed)
HISTORY AND PHYSICAL INTERVAL NOTE: ? ?10/06/2021 ? ?7:24 AM ? 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  The various methods of treatment have been discussed with the patient.  No guarantees were given.  After consideration of risks, benefits and other options for treatment, the patient has consented to surgery.  I have reviewed the patients? chart and labs.   ? ?PROCEDURE: ALL RIGHT FOOT/LEG ?GASTROC RECESSION ?MEDIAL CALCANEAL DISPLACEMENT OSTEOTOMY ?EVANS CALCANEAL OSTEOTOMY ?POSTERIOR TIBIAL TENDON REPAIR WITH POSSIBLE FLEXOR TENDON TRANSFER ?COTTON OSTEOTOMY ? ?A history and physical examination was performed in my office.  The patient was reexamined.  There have been no changes to this history and physical examination. ? ?Caroline More, DPM ? ?

## 2021-10-06 NOTE — Anesthesia Procedure Notes (Signed)
Procedure Name: General with mask airway ?Date/Time: 10/06/2021 8:40 AM ?Performed by: Jeannene Patella, CRNA ?Pre-anesthesia Checklist: Patient identified, Emergency Drugs available, Suction available, Timeout performed and Patient being monitored ?Patient Re-evaluated:Patient Re-evaluated prior to induction ?Oxygen Delivery Method: Nasal cannula and Simple face mask ?Induction Type: IV induction ?Placement Confirmation: positive ETCO2 ? ? ? ? ?

## 2021-10-06 NOTE — Anesthesia Procedure Notes (Signed)
Anesthesia Regional Block: Adductor canal block  ? ?Pre-Anesthetic Checklist: , timeout performed,  Correct Patient, Correct Site, Correct Laterality,  Correct Procedure, Correct Position, site marked,  Risks and benefits discussed,  Surgical consent,  Pre-op evaluation,  At surgeon's request and post-op pain management ? ?Laterality: Right ? ?Prep: chloraprep     ?  ?Needles:  ?Injection technique: Single-shot ? ?Needle Type: Echogenic Needle   ? ? ?Needle Length: 9cm  ?Needle Gauge: 21  ? ? ? ?Additional Needles: ? ? ?Procedures:,,,, ultrasound used (permanent image in chart),,    ?Narrative:  ?Start time: 10/06/2021 8:10 AM ?End time: 10/06/2021 8:15 AM ?Injection made incrementally with aspirations every 5 mL. ? ?Performed by: Personally  ? ?Additional Notes: ?Functioning IV was confirmed and monitors applied. Ultrasound guidance: relevant anatomy identified, needle position confirmed, local anesthetic spread visualized around nerve(s)., vascular puncture avoided.  Image printed for medical record.  Negative aspiration and no paresthesias; incremental administration of local anesthetic. The patient tolerated the procedure well. Vitals signes recorded in RN notes. ? ? ? ?

## 2021-10-06 NOTE — Anesthesia Postprocedure Evaluation (Signed)
Anesthesia Post Note ? ?Patient: Kerry [name] ? ?Procedure(s) Performed: GASTROC RECESSION (Right) ?EVANS/MCDO X 2 (Right) ?FLEXOR TENDON REPAIR - SECOND - POSTERIOR TIBIAL TENDON REPAIR (Right) ? ? ?  ?Patient location during evaluation: PACU ?Anesthesia Type: Regional ?Level of consciousness: awake and alert ?Pain management: pain level controlled ?Vital Signs Assessment: post-procedure vital signs reviewed and stable ?Respiratory status: spontaneous breathing, nonlabored ventilation, respiratory function stable and patient connected to nasal cannula oxygen ?Cardiovascular status: blood pressure returned to baseline and stable ?Postop Assessment: no apparent nausea or vomiting ?Anesthetic complications: no ? ? ?No notable events documented. ? ?Adele Barthel Tristian Bouska ? ? ? ? ? ?

## 2021-10-06 NOTE — Op Note (Signed)
PODIATRY / FOOT AND ANKLE SURGERY OPERATIVE REPORT ? ? ? ?SURGEON: Caroline More, DPM ? ?PRE-OPERATIVE DIAGNOSIS:  ?1.  Right posterior tibial tendon dysfunction with tear of the posterior tibial tendon/tenosynovitis/tendinosis ?2.  Pes planus right ?3.  Equinus right ? ?POST-OPERATIVE DIAGNOSIS: Same ? ?PROCEDURE(S): ?Right gastroc recession ?Right Evans calcaneal osteotomy ?Right medial calcaneal displacement slide osteotomy ?Right posterior tibial tendon repair with advancement on the navicular ? ?HEMOSTASIS: Right thigh tourniquet ? ?ANESTHESIA: general ? ?ESTIMATED BLOOD LOSS: 50 cc ? ?FINDING(S): ?1.  Split thickness tear of the posterior tibial tendon about the medial malleolus extending to the medial foot ? ?PATHOLOGY/SPECIMEN(S): None ? ?INDICATIONS:   ?Kerry [name] is a 36 y.o. 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  Patient has been treated conservatively with cam boot offloading as well as usage of custom inserts and good supportive shoes.  Patient has also been treated with anti-inflammatory medications but still continues to have pain discomfort despite these measures and physical therapy.  An MRI was performed of the right ankle showing a split thickness tear of the posterior tibial tendon as well as some inflammation within the sinus tarsi due to a flatfoot type.  All treatment options were discussed with the patient both conservative and surgical attempts at correction clean potential risks and complications at this time patient is elected for surgical procedure described above.  No guarantees given.  Consent obtained prior to procedure. ? ?DESCRIPTION: ?Preoperatively of saphenous and popliteal nerve block was performed by anesthesia with Exparel.  After obtaining full informed written consent, the patient was brought back to the operating room and placed supine upon the operating table.  The patient received IV antibiotics prior to  induction.  After obtaining adequate anesthesia, the patient was prepped and draped in the standard fashion.  An Esmarch bandage was used to exsanguinate the right lower extremity and pneumatic thigh tourniquet was inflated. ? ?Attention was directed to the posterior aspect of the right lower leg distal to the myotendinous junction where a midline incision was made.  The incision was deepened to the subcutaneous tissues utilizing sharp and blunt dissection and care was taken to identify and retract all vital neurovascular structures and all venous contributories were cauterized as necessary.  At this time the deep fascia was able to be identified and the saphenous and sural nerve was identified and retracted throughout the remainder the case.  The deep fascia was then incised and retracted medially and laterally thereby exposing the gastroc aponeurosis.  The gastroc aponeurosis and plantaris tendon was transected from medial to lateral across the entire length completing gastroc recession and plantaris release.  The surgical site was flushed with copious amounts normal sterile saline.  The deep fascia was reapproximated well coapted with 3-0 Vicryl.  The subcutaneous tissue was reapproximated well coapted with 3-0 Vicryl and the skin was then reapproximated well coapted with skin staples.  ? ?Attention was then directed to the right lateral heel where under fluoroscopic guidance an incision was marked out through the calcaneal body external to the subtalar joint and anterior to the insertion point of the Achilles.  Once this was marked out an incision was made over this area, the incision was deepened through the subcutaneous tissues utilizing sharp and blunt dissection care was taken to identify retract all vital neurovascular structures no venous contributories were cauterized as necessary.  The sural nerve was retracted throughout the remainder the case in this area.  At this time an incision was made into the  periosteum of the lateral wall the calcaneus.  This was made in such a way that the periosteum was incised where the osteotomy was to be performed.  This was marked once again under fluoroscopic guidance.  The periosteum was then elevated distally and proximally thereby exposing the lateral wall the calcaneus.  Homans were then placed at the dorsal and plantar surface of the calcaneus and a osteotomy was marked under fluoroscopic guidance of the calcaneus.  The osteotomy was then made from lateral to medial.  The osteotomy was completed with an osteotome and mallet.  The calcaneal tuber was then able to be manipulated.  The calcaneal tuber was then translated medially 6 to 7 mm to realign the heel with the ankle joint which was once again under fluoroscopic guidance.  Once this was in place a temporary wire was then placed to the calcaneal tuber and into the calcaneal body across the osteotomy site to hold the area intact.  Once again correction was checked under fluoroscopic guidance and the lateral, AP, and axial views.  Correction appeared to be excellent overall.  Reduction was noted in the talar head uncoverage, 20% remained uncovered.  At this time the Paragon 28 lateral calcaneal plate was then placed with the appropriate placement in the two 3.5 locking screws then placed into the calcaneal tuber.  Once this was placed the 3.5 screw was then placed through the compression slot.  Before tightening down the compression screw a wire driver was used to take out the temporary wire fixation.  The 3.5 compression screw was then placed and seated excellently overall with excellent compression noted.  Placement of the screws was checked under fluoroscopic guidance as well which appeared to be appropriate.  Compression appeared to be achieved across the osteotomy site and the corrected position. ? ?Attention was then directed to the lateral aspect of the calcaneus near the anterior process where a linear longitudinal  incision was made from the calcaneocuboid joint to the lateral process of the talus.  The incision was deepened to the subcutaneous tissues utilizing sharp and blunt dissection and care was taken to identify and retract all vital neurovascular structures no venous contributories were cauterized as necessary.  At this time the peroneal tendon sheaths were identified and opened and the peroneus brevis and longus tendons were retracted plantarly.  The lateral aspect of the calcaneus was noted at the anterior process.  A periosteal incision was then made into the lateral wall the calcaneus near the anterior process at the angle of Gissane.  The periosteum was then elevated thereby exposing the lateral calcaneus.  A guidewire was then placed into the lateral calcaneus.  Fluoroscopic guidance was used to show correct placement of the wire which was a guide for the Evans calcaneal osteotomy.  The Evans calcaneal osteotomy was then performed from lateral to medial.  The osteotomy was completed with a osteotome.  Once the osteotomy appeared to be completed and the medial hands was still intact a distractor was then placed into this area using 2 pins in the distractor to open this up.  A 6 mm Evans wedge trial was then placed.  Under fluoroscopic guidance the wedge appeared to sit excellently and the talar head appeared to be 100% covered.  At this time a Paragon 28 6 mm wedge was prepped and placed into the osteotomy site to complete the Evans calcaneal osteotomy procedure.  The wires for the distractor were  taken out along with the distractor and there appeared to be excellent compression across the Evans calcaneal osteotomy site.  The anterior process the calcaneus appeared to not be mobile at all and appeared to be compressed excellently and the calcaneocuboid joint was not entered during the procedure.  No further fixation was felt to be needed at that time to the area as it appeared to be very stable and compressed.   Fluoroscopic guidance was once again the used to verify correction as well as compression across the osteotomy site which appeared to be excellent.  The surgical sites were flushed with copious amounts norm

## 2021-10-07 ENCOUNTER — Encounter: Payer: Self-pay | Admitting: Podiatry

## 2022-05-24 IMAGING — US US ABDOMEN LIMITED
1 series · 11 of 11 positions shown · non-contrast
Comparison: 11/24/2019 pelvic ultrasound

CLINICAL DATA: RIGHT lower quadrant mass at Caesarean scar since
February 2019, increasing in size and sometimes tender

EXAM:
ULTRASOUND ABDOMEN LIMITED

[Series 1: us abdomen limited · 0.07mm/px · 11 acquisitions, 11 frames shown]
[im 1/11]
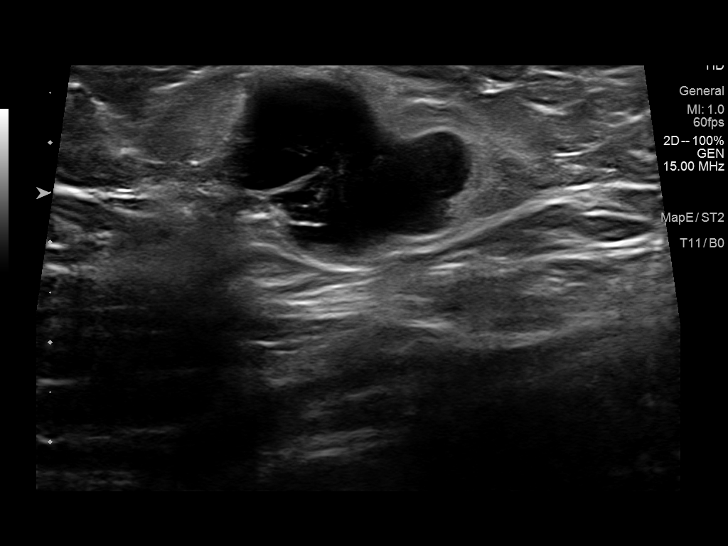
[im 2/11]
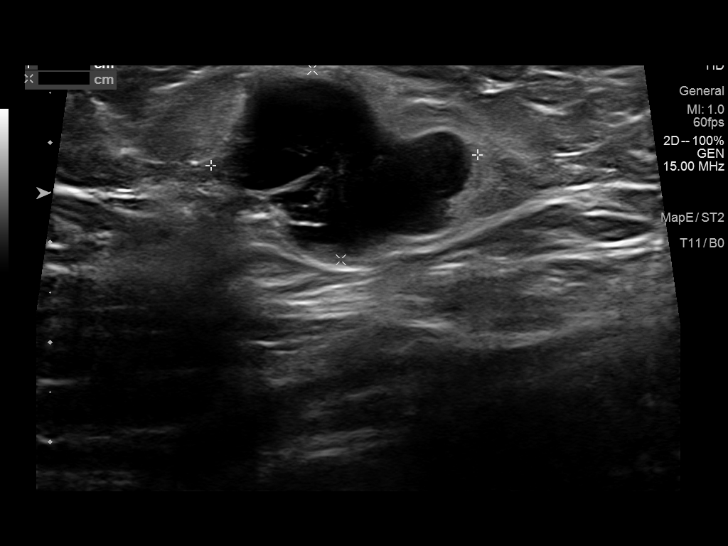
[im 3/11]
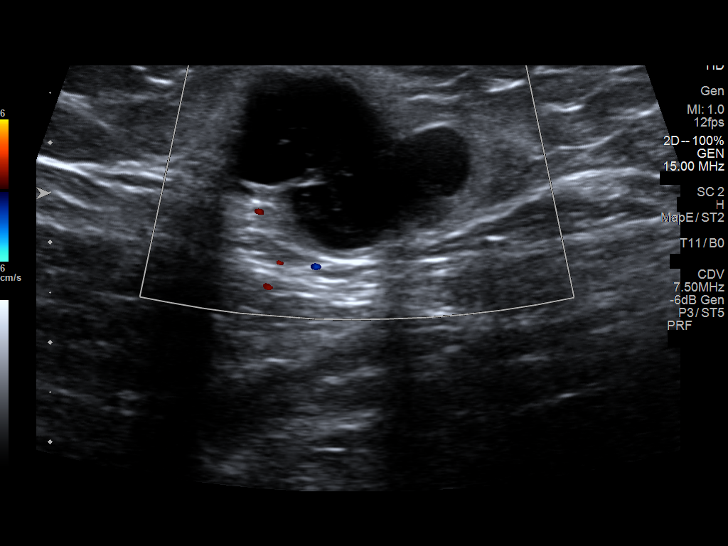
[im 4/11]
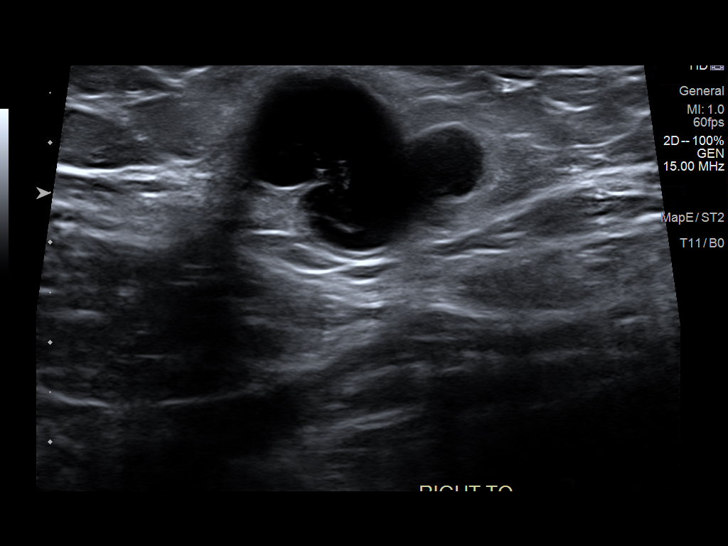
[im 5/11]
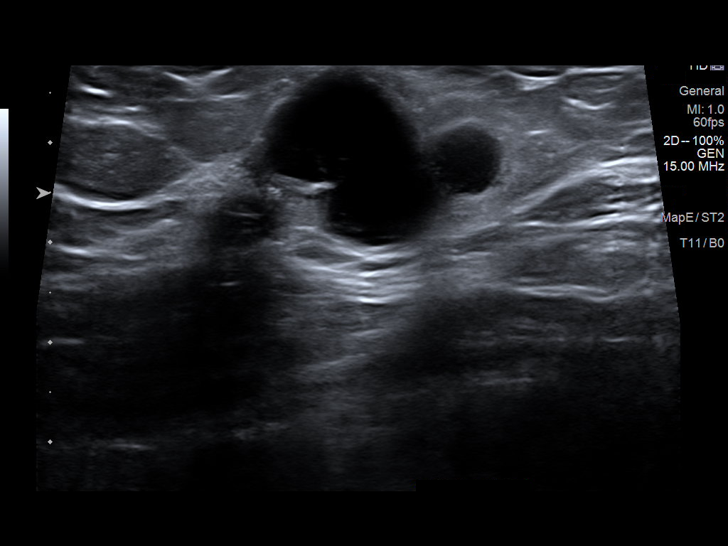
[im 6/11]
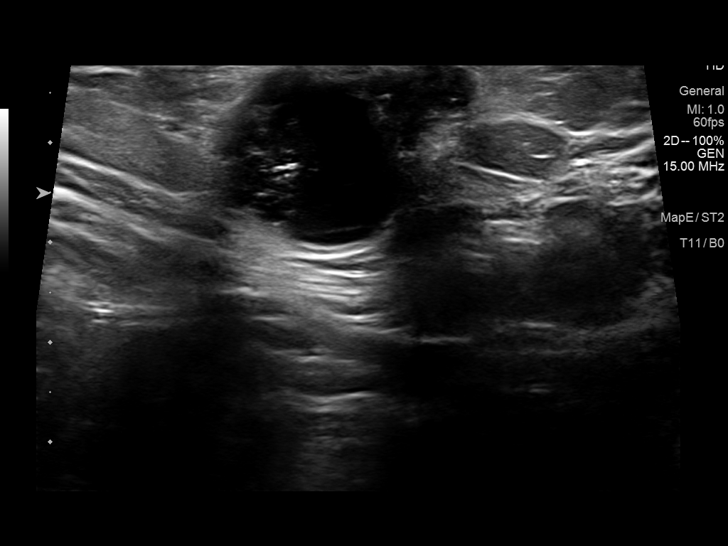
[im 7/11]
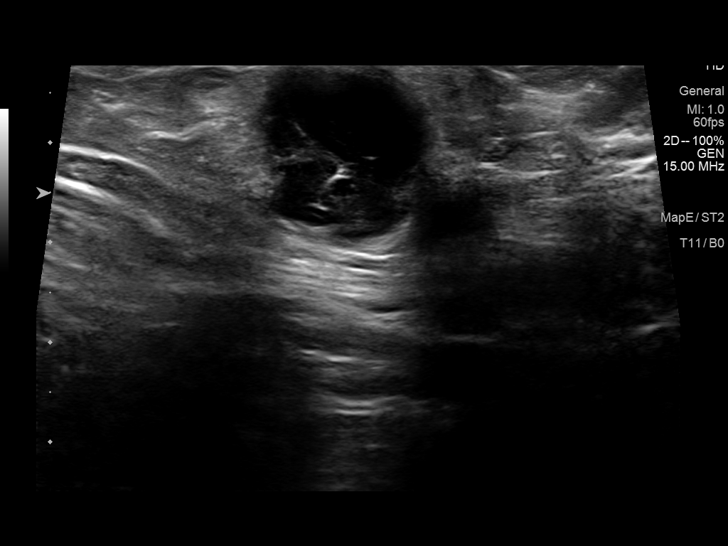
[im 8/11]
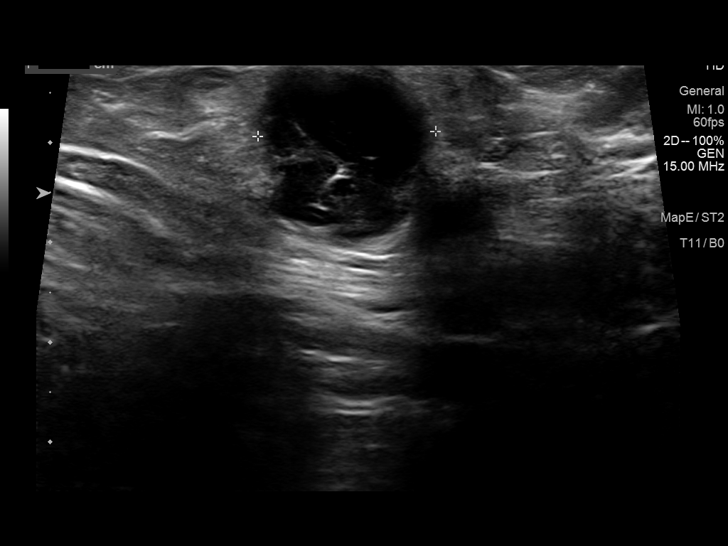
[im 9/11]
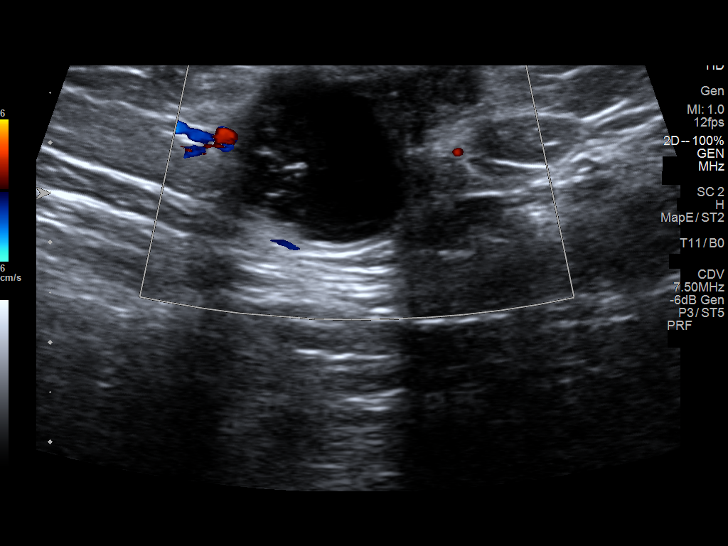
[im 10/11]
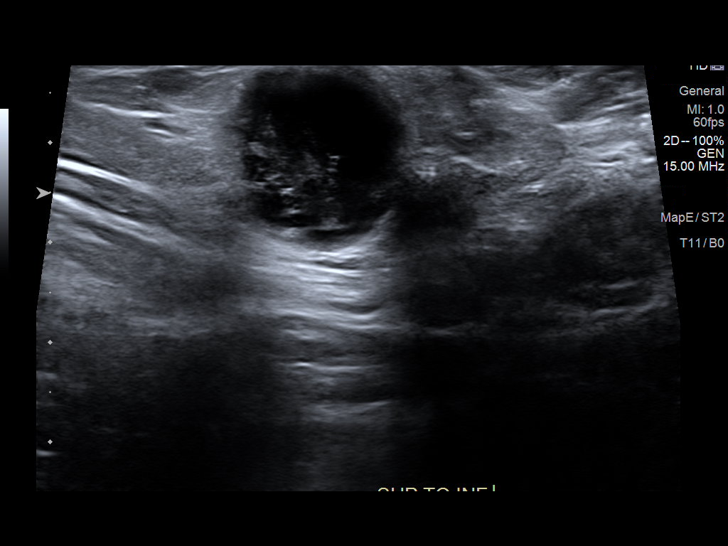
[im 11/11]
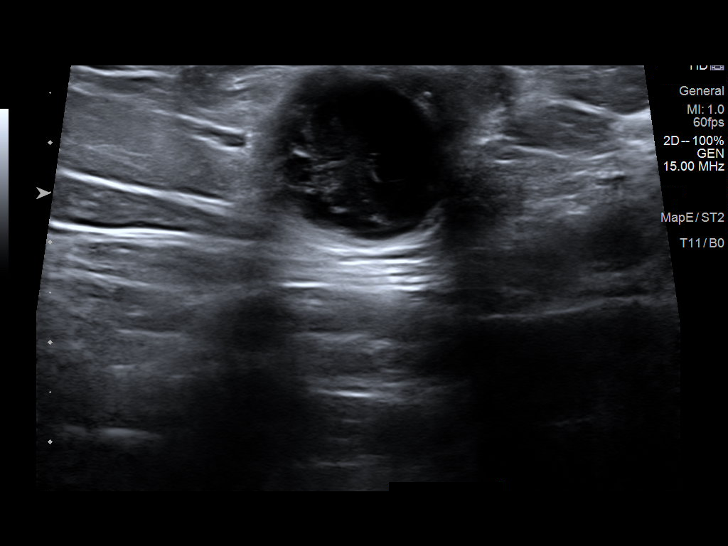

[11 of 11 positions shown; findings below may reference images not displayed]

FINDINGS: At the site of clinical concern at the RIGHT lateral aspect of the
Caesarean section scar, a complex hypoechoic/cystic lesion is
identified measuring 2.7 x 1.9 x 1.8 cm. This is macrolobulated and
multiloculated in character and contains anechoic cystic regions,
complex hypoechoic internal echogenicity within some locules, and
additional areas which contain heterogeneous echogenicity. No
accompanying hypervascularity on color Doppler imaging. No shadowing
calcifications. Lesion is immediately subcutaneous in position and
nonspecific in appearance. This could represent a complex seroma,
sequela of prior hemorrhage, complicated suture granuloma with
degeneration/necrosis, or other nonspecific cystic mass.
IMPRESSION: Nonspecific macrolobulated multiloculated complex cystic lesion at
the site of clinical concern in the RIGHT lower quadrant, 2.7 cm,
question enlarging complex seroma, sequela of hemorrhage,
complicated suture granuloma with degeneration/necrosis, or other
nonspecific cystic mass; since this is nonspecific, consider
excision.

## 2023-09-08 ENCOUNTER — Ambulatory Visit: Admission: EM | Admit: 2023-09-08 | Discharge: 2023-09-08 | Disposition: A | Discharge status: Home / Self Care

## 2023-09-08 ENCOUNTER — Encounter: Payer: Self-pay | Admitting: Emergency Medicine

## 2023-09-08 DIAGNOSIS — S161XXA Strain of muscle, fascia and tendon at neck level, initial encounter: Secondary | ICD-10-CM | POA: Diagnosis not present

## 2023-09-08 MED ORDER — CYCLOBENZAPRINE HCL 10 MG PO TABS: 10.0000 mg | ORAL_TABLET | Freq: Three times a day (TID) | ORAL | 0 refills | Status: AC | PRN
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
## 2023-09-08 NOTE — Discharge Instructions (Signed)
NECK PAIN: Stressed avoiding painful activities. This can exacerbate your symptoms and make them worse.  May apply heat to the areas of pain for some relief. Use medications as directed. Be aware of which medications make you drowsy and do not drive or operate any kind of heavy machinery while using the medication (ie pain medications or muscle relaxers). F/U with PCP for reexamination or return sooner if condition worsens or does not begin to improve over the next few days.   NECK PAIN RED FLAGS: If symptoms get worse than they are right now, you should come back sooner for re-evaluation. If you have increased numbness/ tingling or notice that the numbness/tingling is affecting the legs or saddle region, go to ER. If you ever lose continence go to ER.
